# Patient Record
Sex: Male | Born: 1965
Health system: Southern US, Community
[De-identification: ages and names within clinical notes are randomized; demographics above are authoritative.]

## PROBLEM LIST (undated history)

## (undated) ENCOUNTER — Ambulatory Visit (HOSPITAL_COMMUNITY): Admission: EM | Payer: BC Managed Care – PPO

## (undated) DIAGNOSIS — F329 Major depressive disorder, single episode, unspecified: Secondary | ICD-10-CM

## (undated) DIAGNOSIS — K219 Gastro-esophageal reflux disease without esophagitis: Secondary | ICD-10-CM

## (undated) DIAGNOSIS — F32A Depression, unspecified: Secondary | ICD-10-CM

## (undated) DIAGNOSIS — G4700 Insomnia, unspecified: Secondary | ICD-10-CM

## (undated) DIAGNOSIS — F419 Anxiety disorder, unspecified: Secondary | ICD-10-CM

## (undated) DIAGNOSIS — K449 Diaphragmatic hernia without obstruction or gangrene: Secondary | ICD-10-CM

## (undated) DIAGNOSIS — G4730 Sleep apnea, unspecified: Secondary | ICD-10-CM

## (undated) HISTORY — PX: KNEE ARTHROSCOPY: SUR90

## (undated) HISTORY — PX: OTHER SURGICAL HISTORY: SHX169

---

## 2010-06-08 ENCOUNTER — Ambulatory Visit (INDEPENDENT_AMBULATORY_CARE_PROVIDER_SITE_OTHER): Payer: Private Health Insurance - Indemnity

## 2010-06-08 ENCOUNTER — Inpatient Hospital Stay (INDEPENDENT_AMBULATORY_CARE_PROVIDER_SITE_OTHER)
Admission: RE | Admit: 2010-06-08 | Discharge: 2010-06-08 | Disposition: A | Discharge status: Home / Self Care | Payer: Private Health Insurance - Indemnity | Source: Ambulatory Visit | Attending: Family Medicine | Admitting: Family Medicine

## 2010-06-08 DIAGNOSIS — J019 Acute sinusitis, unspecified: Secondary | ICD-10-CM

## 2010-06-08 LAB — POCT RAPID STREP A: Streptococcus, Group A Screen (Direct): NEGATIVE

## 2011-02-09 ENCOUNTER — Emergency Department (HOSPITAL_COMMUNITY)
Admission: EM | Admit: 2011-02-09 | Discharge: 2011-02-09 | Disposition: A | Discharge status: Home / Self Care | Payer: Managed Care, Other (non HMO) | Source: Home / Self Care | Attending: Emergency Medicine | Admitting: Emergency Medicine

## 2011-02-09 ENCOUNTER — Encounter (HOSPITAL_COMMUNITY): Payer: Self-pay | Admitting: *Deleted

## 2011-02-09 DIAGNOSIS — J40 Bronchitis, not specified as acute or chronic: Secondary | ICD-10-CM

## 2011-02-09 HISTORY — DX: Sleep apnea, unspecified: G47.30

## 2011-02-09 HISTORY — DX: Insomnia, unspecified: G47.00

## 2011-02-09 HISTORY — DX: Gastro-esophageal reflux disease without esophagitis: K21.9

## 2011-02-09 HISTORY — DX: Anxiety disorder, unspecified: F41.9

## 2011-02-09 HISTORY — DX: Depression, unspecified: F32.A

## 2011-02-09 HISTORY — DX: Diaphragmatic hernia without obstruction or gangrene: K44.9

## 2011-02-09 HISTORY — DX: Major depressive disorder, single episode, unspecified: F32.9

## 2011-02-09 MED ORDER — PSEUDOEPHEDRINE HCL 60 MG PO TABS: 60.0000 mg | ORAL_TABLET | Freq: Four times a day (QID) | ORAL | Status: AC | PRN

## 2011-02-09 MED ORDER — AMOXICILLIN 500 MG PO CAPS: 500.0000 mg | ORAL_CAPSULE | Freq: Three times a day (TID) | ORAL | Status: AC

## 2011-02-09 NOTE — ED Notes (Signed)
C/O productive cough - much worse at night.  Also c/o fatigue.  Started w/ cold/URI sxs beginning Jan w/ fever/chills - had improved, but states "I just can't quite get over it".  C/O postnasal drip with right sided sinus pressure.  Has been taking Nyquil with some improvement in cough at night with it; was taking Sudafed when initial sxs started.

## 2011-02-09 NOTE — ED Provider Notes (Signed)
History     CSN: [id number]  Arrival date & time 02/09/11  1249   First MD Initiated Contact with Patient 02/09/11 1319      Chief Complaint  Patient presents with  . Cough    (Consider location/radiation/quality/duration/timing/severity/associated sxs/prior treatment) HPI Comments: X 3 WEEKS, COUGHING GETTING WORSE WITH SINUS CONGESTION AND POST-NASAL DRAINAGE" NO FEVERS  Patient is a 46 y.o. male presenting with cough. The history is provided by the patient.  Cough This is a chronic (3 WEEKS) problem. The current episode started more than 1 week ago. The problem has not changed since onset.The cough is productive of sputum. There has been no fever. Associated symptoms include ear congestion and shortness of breath. Pertinent negatives include no chills and no wheezing. He has tried nothing for the symptoms. The treatment provided no relief. His past medical history is significant for bronchitis.    Past Medical History  Diagnosis Date  . Sleep apnea   . Depression   . Anxiety   . Insomnia   . Hiatal hernia   . GERD (gastroesophageal reflux disease)     Past Surgical History  Procedure Date  . Knee arthroscopy   . Hydrocele     No family history on file.  History  Substance Use Topics  . Smoking status: Former Smoker    Types: Cigars  . Smokeless tobacco: Not on file  . Alcohol Use: No      Review of Systems  Constitutional: Negative for fever and chills.  HENT: Positive for congestion.   Respiratory: Positive for cough and shortness of breath. Negative for wheezing.     Allergies  Review of patient's allergies indicates no known allergies.  Home Medications   Current Outpatient Rx  Name Route Sig Dispense Refill  . CLONAZEPAM PO Oral Take 4 mg by mouth at bedtime as needed.    Marland Kitchen LISINOPRIL 40 MG PO TABS Oral Take 40 mg by mouth daily.    Marland Kitchen OMEPRAZOLE 20 MG PO CPDR Oral Take 20 mg by mouth as needed.    . SERTRALINE HCL 50 MG PO TABS Oral Take 50 mg  by mouth daily.    . TESTOSTERONE ENANTHATE 200 MG/ML IM OIL Intramuscular Inject into the muscle every 14 (fourteen) days. For IM use only    . TRIAMCINOLONE ACETONIDE NA Nasal Place into the nose.    Marland Kitchen AMOXICILLIN 500 MG PO CAPS Oral Take 1 capsule (500 mg total) by mouth 3 (three) times daily. 30 capsule 0  . PSEUDOEPHEDRINE HCL 60 MG PO TABS Oral Take 1 tablet (60 mg total) by mouth every 6 (six) hours as needed for congestion. 15 tablet 0    BP 130/79  Pulse 76  Temp(Src) 98.1 F (36.7 C) (Oral)  Resp 18  SpO2 95%  Physical Exam  Nursing note and vitals reviewed. Constitutional: He appears well-developed and well-nourished. No distress.  HENT:  Head: Normocephalic.  Right Ear: Tympanic membrane normal.  Left Ear: Tympanic membrane normal.  Mouth/Throat: Uvula is midline and mucous membranes are normal. Posterior oropharyngeal erythema present.  Eyes: Conjunctivae are normal.  Neck: Neck supple. No JVD present.  Pulmonary/Chest: Effort normal. No respiratory distress. He has no decreased breath sounds. He has no wheezes. He has no rhonchi. He has no rales. He exhibits no tenderness.  Lymphadenopathy:    He has no cervical adenopathy.    ED Course  Procedures (including critical care time)  Labs Reviewed - No data to display No results found.  1. Bronchitis       MDM  Bronchitis post-ILI (AFEBRILE-NO RESPIRATORY DISTRESS)        Jimmie Molly, MD 02/09/11 [phone]

## 2013-02-27 IMAGING — CR DG CHEST 2V
2 series · 2 of 2 positions shown · non-contrast
Comparison: None.

CLINICAL DATA: Cough for 3-4 days, decreased oxygen saturation

CHEST - 2 VIEW

[view not recorded (1 of 2)]
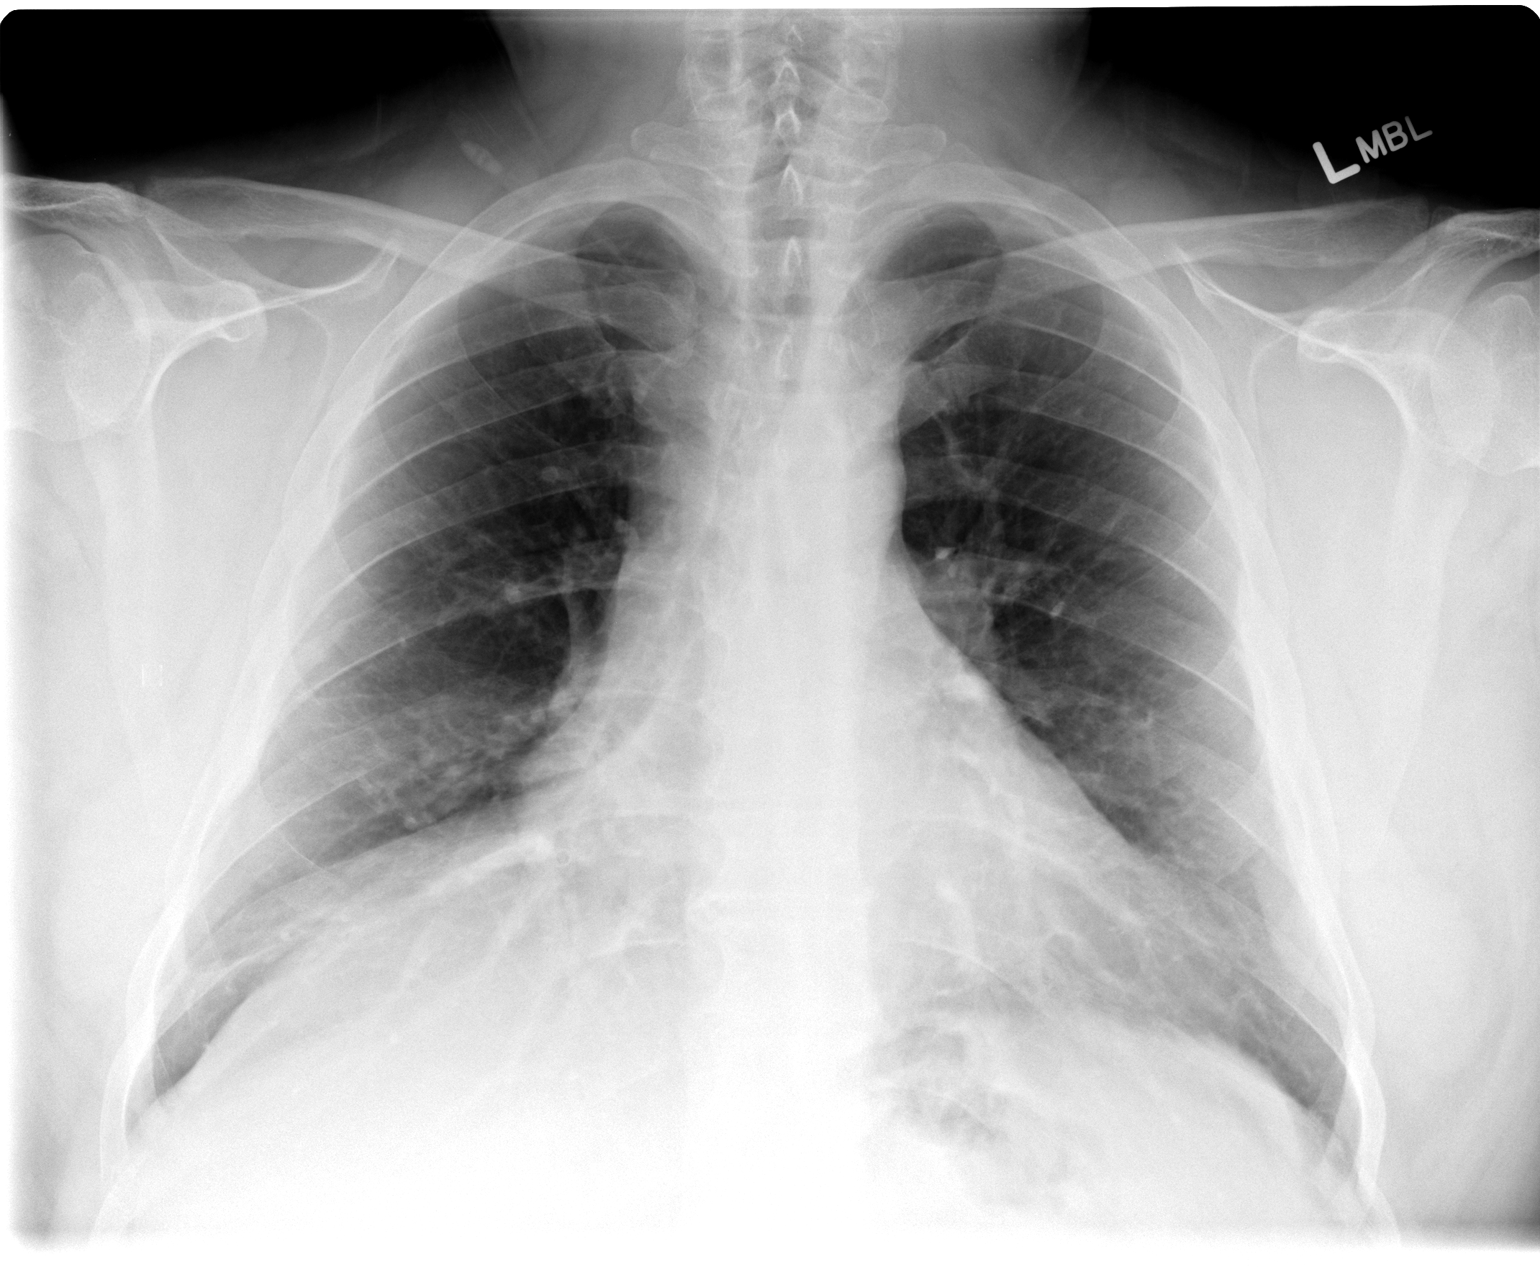

[view not recorded (2 of 2)]
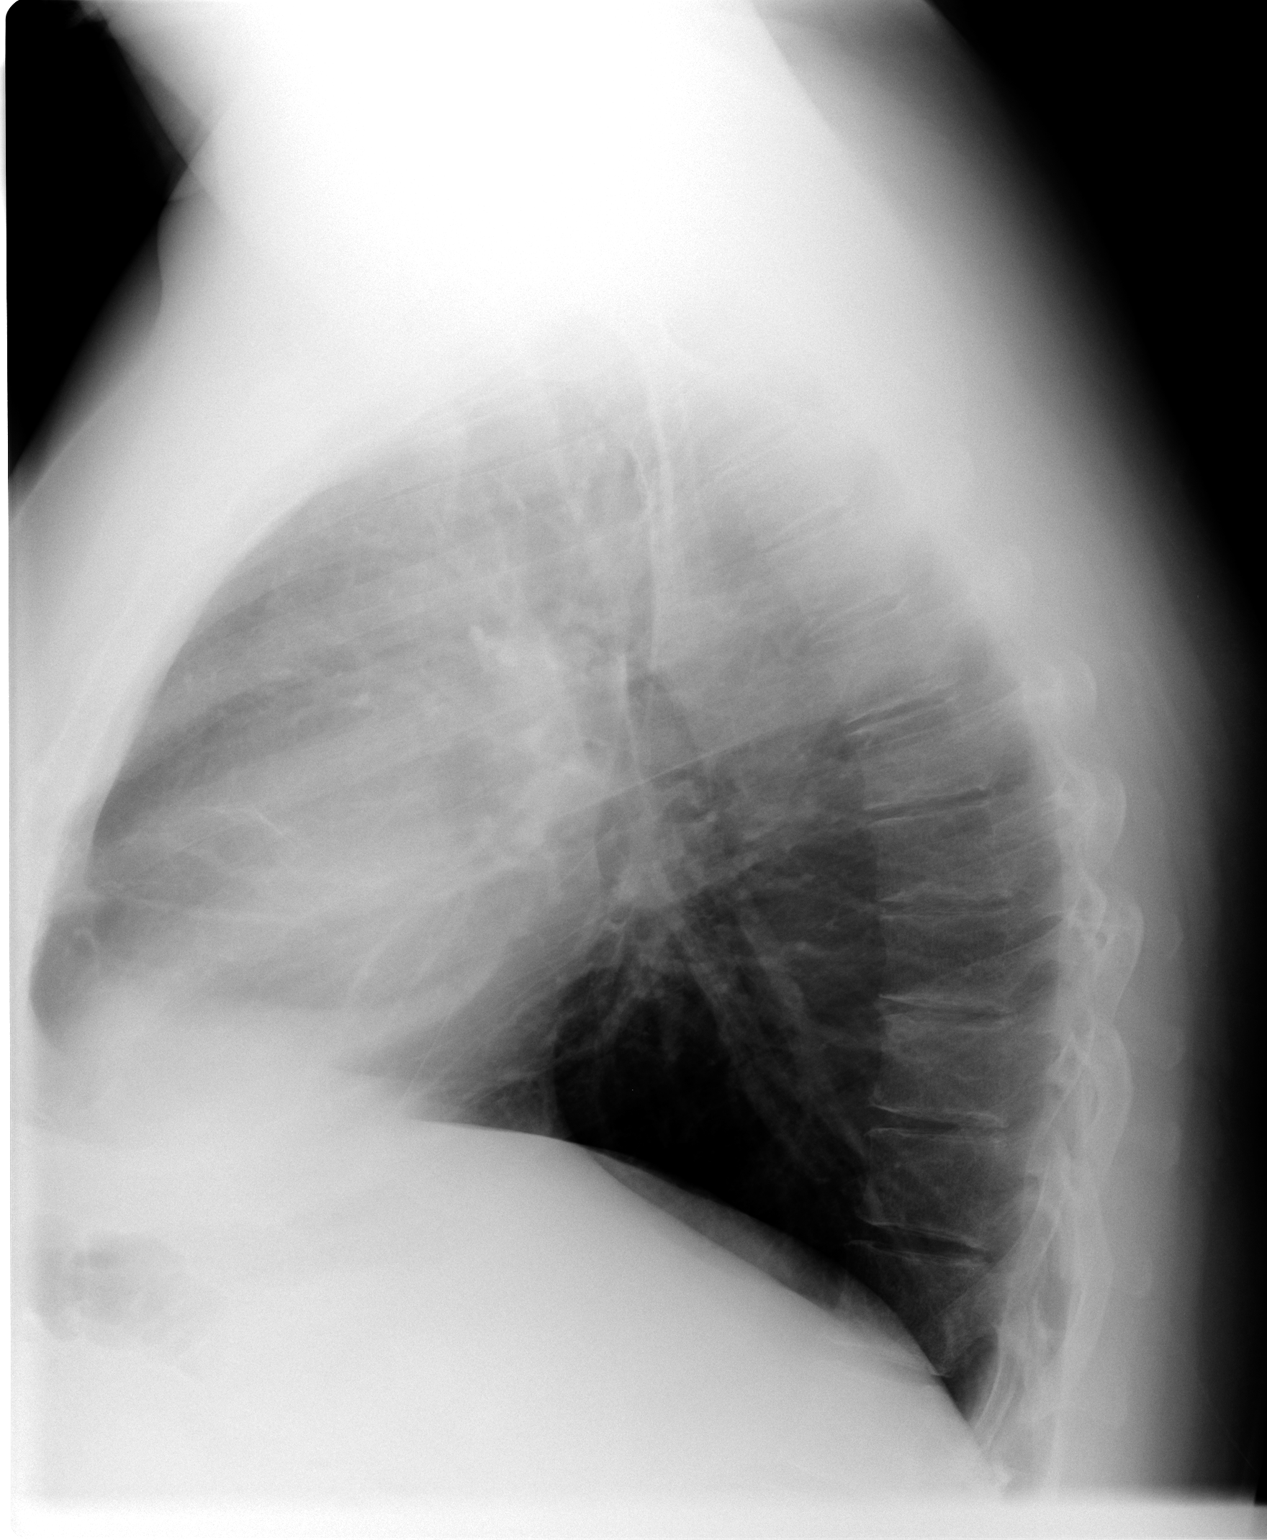

[2 of 2 positions shown; findings below may reference images not displayed]

FINDINGS: No active infiltrate or effusion is seen.  Linear
scarring or atelectasis is noted at the right lung base.  The heart
is within upper limits of normal.  No bony abnormality is seen.
IMPRESSION: Mild right basilar linear atelectasis or scarring.  No active lung
disease.

## 2014-01-17 DIAGNOSIS — G4733 Obstructive sleep apnea (adult) (pediatric): Secondary | ICD-10-CM | POA: Insufficient documentation

## 2014-01-17 DIAGNOSIS — G4730 Sleep apnea, unspecified: Secondary | ICD-10-CM | POA: Insufficient documentation

## 2015-10-30 DIAGNOSIS — G4733 Obstructive sleep apnea (adult) (pediatric): Secondary | ICD-10-CM | POA: Diagnosis not present

## 2015-10-31 DIAGNOSIS — F329 Major depressive disorder, single episode, unspecified: Secondary | ICD-10-CM | POA: Diagnosis not present

## 2015-12-11 DIAGNOSIS — F329 Major depressive disorder, single episode, unspecified: Secondary | ICD-10-CM | POA: Diagnosis not present

## 2015-12-17 DIAGNOSIS — J069 Acute upper respiratory infection, unspecified: Secondary | ICD-10-CM | POA: Diagnosis not present

## 2015-12-17 DIAGNOSIS — J019 Acute sinusitis, unspecified: Secondary | ICD-10-CM | POA: Diagnosis not present

## 2015-12-17 DIAGNOSIS — J310 Chronic rhinitis: Secondary | ICD-10-CM | POA: Diagnosis not present

## 2015-12-17 DIAGNOSIS — R05 Cough: Secondary | ICD-10-CM | POA: Diagnosis not present

## 2015-12-24 DIAGNOSIS — J3489 Other specified disorders of nose and nasal sinuses: Secondary | ICD-10-CM | POA: Diagnosis not present

## 2015-12-24 DIAGNOSIS — J310 Chronic rhinitis: Secondary | ICD-10-CM | POA: Diagnosis not present

## 2015-12-24 DIAGNOSIS — J0191 Acute recurrent sinusitis, unspecified: Secondary | ICD-10-CM | POA: Diagnosis not present

## 2015-12-24 DIAGNOSIS — J342 Deviated nasal septum: Secondary | ICD-10-CM | POA: Diagnosis not present

## 2016-02-01 DIAGNOSIS — F329 Major depressive disorder, single episode, unspecified: Secondary | ICD-10-CM | POA: Diagnosis not present

## 2016-02-12 DIAGNOSIS — G4733 Obstructive sleep apnea (adult) (pediatric): Secondary | ICD-10-CM | POA: Diagnosis not present

## 2016-03-04 DIAGNOSIS — F329 Major depressive disorder, single episode, unspecified: Secondary | ICD-10-CM | POA: Diagnosis not present

## 2016-03-18 DIAGNOSIS — I10 Essential (primary) hypertension: Secondary | ICD-10-CM | POA: Diagnosis not present

## 2016-03-18 DIAGNOSIS — E559 Vitamin D deficiency, unspecified: Secondary | ICD-10-CM | POA: Diagnosis not present

## 2016-03-18 DIAGNOSIS — Z79899 Other long term (current) drug therapy: Secondary | ICD-10-CM | POA: Diagnosis not present

## 2016-03-18 DIAGNOSIS — J309 Allergic rhinitis, unspecified: Secondary | ICD-10-CM | POA: Diagnosis not present

## 2016-03-20 DIAGNOSIS — J0191 Acute recurrent sinusitis, unspecified: Secondary | ICD-10-CM | POA: Diagnosis not present

## 2016-03-21 DIAGNOSIS — R05 Cough: Secondary | ICD-10-CM | POA: Diagnosis not present

## 2016-03-21 DIAGNOSIS — J310 Chronic rhinitis: Secondary | ICD-10-CM | POA: Diagnosis not present

## 2016-03-21 DIAGNOSIS — R0982 Postnasal drip: Secondary | ICD-10-CM | POA: Diagnosis not present

## 2016-03-21 DIAGNOSIS — Z713 Dietary counseling and surveillance: Secondary | ICD-10-CM | POA: Diagnosis not present

## 2016-04-15 DIAGNOSIS — F329 Major depressive disorder, single episode, unspecified: Secondary | ICD-10-CM | POA: Diagnosis not present

## 2016-05-14 DIAGNOSIS — F329 Major depressive disorder, single episode, unspecified: Secondary | ICD-10-CM | POA: Diagnosis not present

## 2016-05-29 DIAGNOSIS — G4733 Obstructive sleep apnea (adult) (pediatric): Secondary | ICD-10-CM | POA: Diagnosis not present

## 2016-06-17 DIAGNOSIS — F329 Major depressive disorder, single episode, unspecified: Secondary | ICD-10-CM | POA: Diagnosis not present

## 2016-06-18 DIAGNOSIS — R1084 Generalized abdominal pain: Secondary | ICD-10-CM | POA: Diagnosis not present

## 2016-06-18 DIAGNOSIS — Z0000 Encounter for general adult medical examination without abnormal findings: Secondary | ICD-10-CM | POA: Diagnosis not present

## 2016-06-18 DIAGNOSIS — Z1389 Encounter for screening for other disorder: Secondary | ICD-10-CM | POA: Diagnosis not present

## 2016-06-18 DIAGNOSIS — Z131 Encounter for screening for diabetes mellitus: Secondary | ICD-10-CM | POA: Diagnosis not present

## 2016-06-18 DIAGNOSIS — R0602 Shortness of breath: Secondary | ICD-10-CM | POA: Diagnosis not present

## 2016-07-16 DIAGNOSIS — I10 Essential (primary) hypertension: Secondary | ICD-10-CM | POA: Diagnosis not present

## 2016-07-16 DIAGNOSIS — G4733 Obstructive sleep apnea (adult) (pediatric): Secondary | ICD-10-CM | POA: Diagnosis not present

## 2016-07-16 DIAGNOSIS — K760 Fatty (change of) liver, not elsewhere classified: Secondary | ICD-10-CM | POA: Diagnosis not present

## 2016-07-16 DIAGNOSIS — E7800 Pure hypercholesterolemia, unspecified: Secondary | ICD-10-CM | POA: Diagnosis not present

## 2016-08-13 DIAGNOSIS — F329 Major depressive disorder, single episode, unspecified: Secondary | ICD-10-CM | POA: Diagnosis not present

## 2016-09-02 DIAGNOSIS — G4733 Obstructive sleep apnea (adult) (pediatric): Secondary | ICD-10-CM | POA: Diagnosis not present

## 2016-09-17 DIAGNOSIS — F329 Major depressive disorder, single episode, unspecified: Secondary | ICD-10-CM | POA: Diagnosis not present

## 2016-10-01 DIAGNOSIS — F329 Major depressive disorder, single episode, unspecified: Secondary | ICD-10-CM | POA: Diagnosis not present

## 2016-10-06 DIAGNOSIS — R0982 Postnasal drip: Secondary | ICD-10-CM | POA: Diagnosis not present

## 2016-10-06 DIAGNOSIS — Z713 Dietary counseling and surveillance: Secondary | ICD-10-CM | POA: Diagnosis not present

## 2016-10-06 DIAGNOSIS — J310 Chronic rhinitis: Secondary | ICD-10-CM | POA: Diagnosis not present

## 2016-10-06 DIAGNOSIS — J069 Acute upper respiratory infection, unspecified: Secondary | ICD-10-CM | POA: Diagnosis not present

## 2016-10-29 DIAGNOSIS — G4733 Obstructive sleep apnea (adult) (pediatric): Secondary | ICD-10-CM | POA: Diagnosis not present

## 2016-11-05 DIAGNOSIS — F329 Major depressive disorder, single episode, unspecified: Secondary | ICD-10-CM | POA: Diagnosis not present

## 2017-01-14 DIAGNOSIS — F329 Major depressive disorder, single episode, unspecified: Secondary | ICD-10-CM | POA: Diagnosis not present

## 2017-01-26 DIAGNOSIS — G4733 Obstructive sleep apnea (adult) (pediatric): Secondary | ICD-10-CM | POA: Diagnosis not present

## 2017-01-28 DIAGNOSIS — G4733 Obstructive sleep apnea (adult) (pediatric): Secondary | ICD-10-CM | POA: Diagnosis not present

## 2017-02-25 DIAGNOSIS — F329 Major depressive disorder, single episode, unspecified: Secondary | ICD-10-CM | POA: Diagnosis not present

## 2017-03-18 ENCOUNTER — Encounter: Payer: Self-pay | Admitting: Family Medicine

## 2017-03-18 ENCOUNTER — Ambulatory Visit: Payer: BLUE CROSS/BLUE SHIELD | Admitting: Family Medicine

## 2017-03-18 ENCOUNTER — Other Ambulatory Visit: Payer: Self-pay

## 2017-03-18 VITALS — BP 128/72 | HR 70 | Temp 98.0°F | Ht 66.5 in | Wt 227.0 lb

## 2017-03-18 DIAGNOSIS — Z114 Encounter for screening for human immunodeficiency virus [HIV]: Secondary | ICD-10-CM

## 2017-03-18 DIAGNOSIS — I10 Essential (primary) hypertension: Secondary | ICD-10-CM | POA: Diagnosis not present

## 2017-03-18 DIAGNOSIS — Z1329 Encounter for screening for other suspected endocrine disorder: Secondary | ICD-10-CM | POA: Diagnosis not present

## 2017-03-18 DIAGNOSIS — Z131 Encounter for screening for diabetes mellitus: Secondary | ICD-10-CM | POA: Diagnosis not present

## 2017-03-18 DIAGNOSIS — Z13220 Encounter for screening for lipoid disorders: Secondary | ICD-10-CM | POA: Diagnosis not present

## 2017-03-18 DIAGNOSIS — F329 Major depressive disorder, single episode, unspecified: Secondary | ICD-10-CM

## 2017-03-18 DIAGNOSIS — Z1211 Encounter for screening for malignant neoplasm of colon: Secondary | ICD-10-CM

## 2017-03-18 DIAGNOSIS — F322 Major depressive disorder, single episode, severe without psychotic features: Secondary | ICD-10-CM | POA: Insufficient documentation

## 2017-03-18 DIAGNOSIS — F32A Depression, unspecified: Secondary | ICD-10-CM

## 2017-03-18 MED ORDER — LISINOPRIL 40 MG PO TABS: 40.0000 mg | ORAL_TABLET | Freq: Every day | ORAL | 0 refills | Status: DC

## 2017-03-18 NOTE — Patient Instructions (Signed)

## 2017-03-18 NOTE — Progress Notes (Signed)
Subjective:   Chief Complaint  Patient presents with  . Establish Care  . Hypertension   HPI Charles Costa is a 52 y.o. old male here  for annual exam.  Concern today: Hypertension, well controlled with current medication Changes in his/her health in the last 12 months: yes, increased stress/anxiety due to multiple death in the family. Occupation:  Unemployed Wears seatbelt: yes.    The patient has regular exercise: no.   Enough vegetables and fruits: yes.  Smokes cigarette: no Drinks EtOH: yes Drug use: no Patient takes ASA: no.  Patient takes vitD & Ca: no. Ever been transfused or tattooed?: no.  The patient is not sexually active.  Patient uses birth control: not applicable.  Domestic violence: not applicable.  Advance directive: no. MOST: no.   History of depression:yes.  Patient dental home: yes.   Immunizations  Needs influenza vaccine: no.  Needs HPV (Women until age 53): not applicable.  Needs Shingrix (all >67yr of age): yes.  Needs Tdap: no.  Needs Pneumococcal: no. 1. 118to 50years of age  -Intermediate risk groups (smokers; chronic heart, lung and liver  disease, DM & alcoholism) PPSV23 alone:  (Grade 1B).   -High risk groups (asplenia, immunocompromised [HIV, CA], CSF leak, cochlear implant, advanced kidney dis)-PCV13, then PPSV23 after 8 wks. (Grade 1B). PCV13 after 89yrf already had PPSV23.  2.   Age ? 65: PCV13 followed by PPSV23 6 to 12 months later. PCV13 after 1y49yr already had PPSV23.  Screening Need colon cancer screening: yes. Need breast cancer ccreening: not applicable. Need cervical cancer Screening: not applicable. STOP BANG >/=3 for OSA: no. Need lung cancer screening (men > 55):no. Need AAA screening (men 65-74, >100 cigarettes):no At risk for skin cancer: no. Need HCV Screening: no. Need STI Screening: no. Fall in the last 12 months:no  PMH/Problem List: has Essential hypertension and Depression on their problem list.   has a  past medical history of Anxiety, Depression, GERD (gastroesophageal reflux disease), Hiatal hernia, Insomnia, and Sleep apnea.  [facility]. No pertinent family history. Family history of heart disease before age of 60 57s: yes. Family history of stroke: no. Family history of cancer: yes.  SH Social History   Tobacco Use  . Smoking status: Former Smoker    Types: Cigars  . Smokeless tobacco: Never Used  Substance Use Topics  . Alcohol use: No  . Drug use: No     Review of Systems      Objective:   Physical Exam Vitals:   03/18/17 1409  BP: 128/72  Pulse: 70  Temp: 98 F (36.7 C)  TempSrc: Oral  SpO2: 97%  Weight: 227 lb (103 kg)  Height: 5' 6.5" (1.689 m)   Body mass index is 36.09 kg/m.  GEN: appears well, no apparent distress. Head: normocephalic and atraumatic  Eyes: conjunctiva without injection, sclera anicteric Ears: external ear and ear canal normal Nares: no rhinorrhea, congestion or erythema  Oropharynx: mmm without erythema or exudation HEM: negative for cervical or periauricular lymphadenopathies CVS: RRR, nl s1 & s2, no murmurs, no edema,  2+ DP & PT bil RESP: no IWOB, good air movement bilaterally, CTAB GI: BS present & normal, soft, NTND, no guarding, no rebound, no mass GU: no suprapubic or CVA tenderness MSK: no focal tenderness or notable swelling SKIN: no apparent skin lesion  ENDO: negative thyromegally  NEURO: alert and oiented appropriately, no gross deficits  PSYCH: euthymic mood with congruent affect  Assessment & Plan:  1. Essential hypertension Well controlledwith current medication. Patient is adamant that only Zestril works for him and would not like to be switched fro that medication to a generic. - lisinopril (ZESTRIL) 40 MG tablet; Take 1 tablet (40 mg total) by mouth daily.  Dispense: 90 tablet; Refill: 0 - CBC with Differential - CMP14+EGFR  2. Depression, unspecified depression type Followed by outpatient  therapist. Currently on clonazepam and Sertraline.   3. Screening for hyperlipidemia  - Lipid panel  4. Screening for diabetes mellitus  - Hemoglobin A1c  5. Screening for hypothyroidism  - TSH  6. Screening for colon cancer  - Ambulatory referral to Gastroenterology  7. Screening for HIV (human immunodeficiency virus)  - HIV antibody    Marjie Skiff, MD [facility] [place], PGY-2  03/18/17  5:28 PM

## 2017-03-19 ENCOUNTER — Telehealth: Payer: Self-pay

## 2017-03-19 LAB — CMP14+EGFR
ALT: 15 IU/L (ref 0–44)
AST: 16 IU/L (ref 0–40)
Albumin/Globulin Ratio: 1.9 (ref 1.2–2.2)
Albumin: 4.7 g/dL (ref 3.5–5.5)
Alkaline Phosphatase: 59 IU/L (ref 39–117)
BUN/Creatinine Ratio: 12 (ref 9–20)
BUN: 9 mg/dL (ref 6–24)
Bilirubin Total: 0.4 mg/dL (ref 0.0–1.2)
CO2: 24 mmol/L (ref 20–29)
Calcium: 9.9 mg/dL (ref 8.7–10.2)
Chloride: 100 mmol/L (ref 96–106)
Creatinine, Ser: 0.76 mg/dL (ref 0.76–1.27)
GFR calc Af Amer: 123 mL/min/{1.73_m2} (ref >59)
GFR calc non Af Amer: 106 mL/min/{1.73_m2} (ref >59)
Globulin, Total: 2.5 g/dL (ref 1.5–4.5)
Glucose: 98 mg/dL (ref 65–99)
Potassium: 4.2 mmol/L (ref 3.5–5.2)
Sodium: 139 mmol/L (ref 134–144)
Total Protein: 7.2 g/dL (ref 6.0–8.5)

## 2017-03-19 LAB — LIPID PANEL
Chol/HDL Ratio: 5 ratio (ref 0.0–5.0)
Cholesterol, Total: 285 mg/dL — ABNORMAL HIGH (ref 100–199)
HDL: 57 mg/dL (ref >39)
LDL Calculated: 197 mg/dL — ABNORMAL HIGH (ref 0–99)
Triglycerides: 154 mg/dL — ABNORMAL HIGH (ref 0–149)
VLDL Cholesterol Cal: 31 mg/dL (ref 5–40)

## 2017-03-19 LAB — CBC WITH DIFFERENTIAL/PLATELET
Basophils Absolute: 0 10*3/uL (ref 0.0–0.2)
Basos: 1 %
EOS (ABSOLUTE): 0.1 10*3/uL (ref 0.0–0.4)
Eos: 2 %
Hematocrit: 42.4 % (ref 37.5–51.0)
Hemoglobin: 14.3 g/dL (ref 13.0–17.7)
Immature Grans (Abs): 0 10*3/uL (ref 0.0–0.1)
Immature Granulocytes: 0 %
Lymphocytes Absolute: 1.9 10*3/uL (ref 0.7–3.1)
Lymphs: 31 %
MCH: 32.2 pg (ref 26.6–33.0)
MCHC: 33.7 g/dL (ref 31.5–35.7)
MCV: 96 fL (ref 79–97)
Monocytes Absolute: 0.4 10*3/uL (ref 0.1–0.9)
Monocytes: 6 %
Neutrophils Absolute: 3.6 10*3/uL (ref 1.4–7.0)
Neutrophils: 60 %
Platelets: 276 10*3/uL (ref 150–379)
RBC: 4.44 x10E6/uL (ref 4.14–5.80)
RDW: 14.2 % (ref 12.3–15.4)
WBC: 6 10*3/uL (ref 3.4–10.8)

## 2017-03-19 LAB — HIV ANTIBODY (ROUTINE TESTING W REFLEX): HIV Screen 4th Generation wRfx: NONREACTIVE

## 2017-03-19 LAB — HEMOGLOBIN A1C
Est. average glucose Bld gHb Est-mCnc: 108 mg/dL
Hgb A1c MFr Bld: 5.4 % (ref 4.8–5.6)

## 2017-03-19 LAB — TSH: TSH: 1.39 u[IU]/mL (ref 0.450–4.500)

## 2017-03-19 NOTE — Telephone Encounter (Signed)
Received fax from [facility] requesting prior authorization of Zestril.  PA submitted via covermymeds. Will recheck status in 24 hours. Wallace Cullens, RN

## 2017-03-23 NOTE — Telephone Encounter (Signed)
Brand only Zestril denied. Pharmacy informed generic is covered if patient is agreeable. Wallace Cullens, RN

## 2017-03-25 DIAGNOSIS — F329 Major depressive disorder, single episode, unspecified: Secondary | ICD-10-CM | POA: Diagnosis not present

## 2017-03-27 ENCOUNTER — Other Ambulatory Visit: Payer: Self-pay | Admitting: Family Medicine

## 2017-03-27 NOTE — Telephone Encounter (Signed)
Zestril (brand name) needs to be authorized for a 90 day refill.  Patient is not using insurance and is paying for it himself.  Call in to [facility] at [place], [employer].

## 2017-03-27 NOTE — Telephone Encounter (Signed)
Spoke with mother to see if the doctor needed to send a new script or if patient was able to use the printed one we gave him at his visit.  She said that patient had already picked up the medication and paid for it out of pocket and it cost him $1300.  Patient also cancelled his insurance since they wouldn't cover the script for him.  I explained to his mother that each insurance is going to want him to try generic versions of this medication before they will even consider the brand name.  Unless he is willing to do that there is nothing as an office that we can do.  I did offer her to try [place] and see if this will help his out of pocket cost.  Mother was appreciative and will look to see if brand names are covered. Jazmin Hartsell,CMA

## 2017-03-27 NOTE — Telephone Encounter (Signed)
Will forward to MD. Jazmin Hartsell,CMA  

## 2017-03-30 ENCOUNTER — Other Ambulatory Visit: Payer: Self-pay

## 2017-03-30 ENCOUNTER — Other Ambulatory Visit: Payer: Self-pay | Admitting: Family Medicine

## 2017-03-30 DIAGNOSIS — E785 Hyperlipidemia, unspecified: Secondary | ICD-10-CM | POA: Insufficient documentation

## 2017-03-30 DIAGNOSIS — I10 Essential (primary) hypertension: Secondary | ICD-10-CM

## 2017-03-30 MED ORDER — LISINOPRIL 40 MG PO TABS: 40.0000 mg | ORAL_TABLET | Freq: Every day | ORAL | 1 refills | Status: DC

## 2017-03-30 MED ORDER — ATORVASTATIN CALCIUM 40 MG PO TABS: 40.0000 mg | ORAL_TABLET | Freq: Every day | ORAL | 0 refills | Status: DC

## 2017-03-30 NOTE — Telephone Encounter (Signed)
Pt called nurse line, left message requesting a refill on Zestril. Called [facility] to find out if he picked up the rx we sent earlier this month- we sent in 90 tabs. Per [facility] pt did pick up Rx #90 tabs. Called patient and left voice mail that he picked up 3 month supply and will not need refills until June. Advised to call back with further questions. Wallace Cullens, RN

## 2017-03-30 NOTE — Telephone Encounter (Signed)
Pt calling back to nurse line stating he wants refill of his Zestril called into [facility], even though he is not due for refill until June as he received a 90 day supply. Pt upset that he wasn't prescribed medication with a refill in the first place. Refill to [facility] [place] Pt call back [phone] Will forward to MD Wallace Cullens, RN

## 2017-04-20 ENCOUNTER — Encounter: Payer: Self-pay | Admitting: Family Medicine

## 2017-04-28 ENCOUNTER — Telehealth: Payer: Self-pay

## 2017-04-28 NOTE — Telephone Encounter (Signed)
VM left on pts phone with  GI information.

## 2017-04-28 NOTE — Telephone Encounter (Signed)
-----   Message from Marjie Skiff, MD sent at 04/21/2017  5:23 PM EDT ----- Page,  Could you please get in touch with patient and inform that [facility] has been trying to schedule an appointment with him but have been unable to reach him. He should call them to schedule an appointment.  Thank you  Marjie Skiff, MD [facility], PGY-2

## 2017-05-21 DIAGNOSIS — G4733 Obstructive sleep apnea (adult) (pediatric): Secondary | ICD-10-CM | POA: Diagnosis not present

## 2017-05-27 DIAGNOSIS — F329 Major depressive disorder, single episode, unspecified: Secondary | ICD-10-CM | POA: Diagnosis not present

## 2017-06-24 DIAGNOSIS — F329 Major depressive disorder, single episode, unspecified: Secondary | ICD-10-CM | POA: Diagnosis not present

## 2017-07-22 DIAGNOSIS — F329 Major depressive disorder, single episode, unspecified: Secondary | ICD-10-CM | POA: Diagnosis not present

## 2017-08-19 DIAGNOSIS — F329 Major depressive disorder, single episode, unspecified: Secondary | ICD-10-CM | POA: Diagnosis not present

## 2017-09-02 DIAGNOSIS — G4733 Obstructive sleep apnea (adult) (pediatric): Secondary | ICD-10-CM | POA: Diagnosis not present

## 2017-09-16 DIAGNOSIS — F329 Major depressive disorder, single episode, unspecified: Secondary | ICD-10-CM | POA: Diagnosis not present

## 2017-10-16 DIAGNOSIS — F329 Major depressive disorder, single episode, unspecified: Secondary | ICD-10-CM | POA: Diagnosis not present

## 2017-10-30 DIAGNOSIS — G4733 Obstructive sleep apnea (adult) (pediatric): Secondary | ICD-10-CM | POA: Diagnosis not present

## 2017-11-25 DIAGNOSIS — F329 Major depressive disorder, single episode, unspecified: Secondary | ICD-10-CM | POA: Diagnosis not present

## 2017-12-09 ENCOUNTER — Encounter: Payer: Self-pay | Admitting: Family Medicine

## 2017-12-09 ENCOUNTER — Ambulatory Visit: Payer: BLUE CROSS/BLUE SHIELD | Admitting: Family Medicine

## 2017-12-09 ENCOUNTER — Other Ambulatory Visit: Payer: Self-pay

## 2017-12-09 VITALS — BP 122/74 | HR 74 | Temp 98.0°F | Ht 67.0 in | Wt 224.0 lb

## 2017-12-09 DIAGNOSIS — I10 Essential (primary) hypertension: Secondary | ICD-10-CM | POA: Diagnosis not present

## 2017-12-09 MED ORDER — BENAZEPRIL HCL 40 MG PO TABS: 40.0000 mg | ORAL_TABLET | Freq: Every day | ORAL | 3 refills | Status: DC

## 2017-12-09 MED FILL — BENAZEPRIL HCL 40 MG TABLET: 40 | 30 days supply | Qty: 30 | Fill #0

## 2017-12-09 NOTE — Assessment & Plan Note (Signed)
Well-controlled hypertension.  Changing medications due to financial burden. -Discontinue lisinopril 40 mg -Start benazepril 40 mg -Patient follow-up in 1 month

## 2017-12-09 NOTE — Progress Notes (Signed)
Established Patient Office Visit  Subjective:  Patient ID: Charles Costa, male    DOB: Jan 02, 1966  Age: 52 y.o. MRN: [id number]  CC:  Chief Complaint  Patient presents with  . Hypertension    HPI Patient has been on namebrand with for several years.  He is tried other generic medication without control of his blood pressure.  Patient is currently having to pay out of pocket for lisinopril namebrand and is costing him "$1300 a month".  Patient is interested in trying medication to lower the cost.  After discussion with patient, will try medication within the same class at equivalent dosing.  Hypertension  This is a chronic problem. The current episode started more than 1 year ago. The problem is unchanged. The problem is controlled. Pertinent negatives include no anxiety, blurred vision, chest pain, headaches, malaise/fatigue, neck pain, palpitations or shortness of breath. There are no associated agents to hypertension. Risk factors for coronary artery disease include male gender, obesity and dyslipidemia. Past treatments include ACE inhibitors. There are no compliance problems.  None. There is no history of chronic renal disease, hyperaldosteronism, pheochromocytoma, sleep apnea or a thyroid problem.     Past Medical History:  Diagnosis Date  . Anxiety   . Depression   . GERD (gastroesophageal reflux disease)   . Hiatal hernia   . Insomnia   . Sleep apnea     Past Surgical History:  Procedure Laterality Date  . hydrocele    . KNEE ARTHROSCOPY      No family history on file.  Social History   Socioeconomic History  . Marital status: Single    Spouse name: Not on file  . Number of children: Not on file  . Years of education: Not on file  . Highest education level: Not on file  Occupational History  . Not on file  Social Needs  . Financial resource strain: Not on file  . Food insecurity:    Worry: Not on file    Inability: Not on file  . Transportation needs:     Medical: Not on file    Non-medical: Not on file  Tobacco Use  . Smoking status: Former Smoker    Types: Cigars  . Smokeless tobacco: Never Used  Substance and Sexual Activity  . Alcohol use: No  . Drug use: No  . Sexual activity: Not on file  Lifestyle  . Physical activity:    Days per week: Not on file    Minutes per session: Not on file  . Stress: Not on file  Relationships  . Social connections:    Talks on phone: Not on file    Gets together: Not on file    Attends religious service: Not on file    Active member of club or organization: Not on file    Attends meetings of clubs or organizations: Not on file    Relationship status: Not on file  . Intimate partner violence:    Fear of current or ex partner: Not on file    Emotionally abused: Not on file    Physically abused: Not on file    Forced sexual activity: Not on file  Other Topics Concern  . Not on file  Social History Narrative  . Not on file    Outpatient Medications Prior to Visit  Medication Sig Dispense Refill  . atorvastatin (LIPITOR) 40 MG tablet Take 1 tablet (40 mg total) by mouth daily. 90 tablet 0  . CLONAZEPAM PO Take 4  mg by mouth at bedtime as needed.    Marland Kitchen ipratropium (ATROVENT) 0.06 % nasal spray Place 2 sprays into both nostrils 3 (three) times daily.    Marland Kitchen lisinopril (PRINIVIL,ZESTRIL) 40 MG tablet Take 40 mg by mouth daily.    Marland Kitchen lisinopril (ZESTRIL) 40 MG tablet Take 1 tablet (40 mg total) by mouth daily. 90 tablet 1  . omeprazole (PRILOSEC) 20 MG capsule Take 20 mg by mouth as needed.    . sertraline (ZOLOFT) 50 MG tablet Take 50 mg by mouth daily.    Marland Kitchen testosterone enanthate (DELATESTRYL) 200 MG/ML injection Inject into the muscle every 14 (fourteen) days. For IM use only    . TRIAMCINOLONE ACETONIDE NA Place into the nose.     No facility-administered medications prior to visit.     No Known Allergies  ROS Review of Systems  Constitutional: Negative for malaise/fatigue.  Eyes:  Negative for blurred vision.  Respiratory: Negative for shortness of breath.   Cardiovascular: Negative for chest pain and palpitations.  Musculoskeletal: Negative for neck pain.  Neurological: Negative for headaches.  All other systems reviewed and are negative.     Objective:    Physical Exam  Constitutional: He is oriented to person, place, and time. He appears well-developed and well-nourished.  HENT:  Head: Normocephalic and atraumatic.  Eyes: No scleral icterus.  Neck: No JVD present. No tracheal deviation present.  Cardiovascular: Normal rate and regular rhythm.  Pulmonary/Chest: Effort normal and breath sounds normal.  Abdominal: Soft. He exhibits no distension.  Musculoskeletal: Normal range of motion. He exhibits no edema.  Neurological: He is alert and oriented to person, place, and time.  Skin: Skin is warm and dry.  Psychiatric:  Slightly anxious    BP 122/74   Pulse 74   Temp 98 F (36.7 C) (Oral)   Ht 5\' 7"  (1.702 m)   Wt 224 lb (101.6 kg)   SpO2 98%   BMI 35.08 kg/m  Wt Readings from Last 3 Encounters:  12/09/17 224 lb (101.6 kg)  03/18/17 227 lb (103 kg)     Health Maintenance Due  Topic Date Due  . COLONOSCOPY  02/06/2017    There are no preventive care reminders to display for this patient.  Lab Results  Component Value Date   TSH 1.390 03/18/2017   Lab Results  Component Value Date   WBC 6.0 03/18/2017   HGB 14.3 03/18/2017   HCT 42.4 03/18/2017   MCV 96 03/18/2017   PLT 276 03/18/2017   Lab Results  Component Value Date   NA 139 03/18/2017   K 4.2 03/18/2017   CO2 24 03/18/2017   GLUCOSE 98 03/18/2017   BUN 9 03/18/2017   CREATININE 0.76 03/18/2017   BILITOT 0.4 03/18/2017   ALKPHOS 59 03/18/2017   AST 16 03/18/2017   ALT 15 03/18/2017   PROT 7.2 03/18/2017   ALBUMIN 4.7 03/18/2017   CALCIUM 9.9 03/18/2017   Lab Results  Component Value Date   CHOL 285 (H) 03/18/2017   Lab Results  Component Value Date   HDL 57  03/18/2017   Lab Results  Component Value Date   LDLCALC 197 (H) 03/18/2017   Lab Results  Component Value Date   TRIG 154 (H) 03/18/2017   Lab Results  Component Value Date   CHOLHDL 5.0 03/18/2017   Lab Results  Component Value Date   HGBA1C 5.4 03/18/2017      Assessment & Plan:   Problem List Items Addressed This  Visit      Cardiovascular and Mediastinum   Benign hypertension - Primary    Well-controlled hypertension.  Changing medications due to financial burden. -Discontinue lisinopril 40 mg -Start benazepril 40 mg -Patient follow-up in 1 month      Relevant Medications   benazepril (LOTENSIN) 40 MG tablet      Meds ordered this encounter  Medications  . benazepril (LOTENSIN) 40 MG tablet    Sig: Take 1 tablet (40 mg total) by mouth daily.    Dispense:  30 tablet    Refill:  3    Generic allowable, may switch to another ACE inhibitor covered on insurance and appropriate dosage (MAX equivalent of lisinopril)    Follow-up: Return in 4 weeks (on 01/06/2018).    Bonnita Hollow, MD

## 2017-12-22 DIAGNOSIS — F329 Major depressive disorder, single episode, unspecified: Secondary | ICD-10-CM | POA: Diagnosis not present

## 2018-01-04 DIAGNOSIS — R05 Cough: Secondary | ICD-10-CM | POA: Diagnosis not present

## 2018-01-04 DIAGNOSIS — J019 Acute sinusitis, unspecified: Secondary | ICD-10-CM | POA: Diagnosis not present

## 2018-01-04 DIAGNOSIS — J069 Acute upper respiratory infection, unspecified: Secondary | ICD-10-CM | POA: Diagnosis not present

## 2018-01-04 DIAGNOSIS — J310 Chronic rhinitis: Secondary | ICD-10-CM | POA: Diagnosis not present

## 2018-01-08 MED FILL — BENAZEPRIL HCL 40 MG TABLET: 40 | 30 days supply | Qty: 30 | Fill #1

## 2018-01-11 NOTE — Progress Notes (Signed)
  Subjective:   Patient ID: Charles Costa    DOB: [birth year], 53 y.o. male   MRN: [id number]  Charles Costa is a 53 y.o. 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 Previously seen ENT and Asthma/Allergy specialist. Reports previously undergoing allergy testing without any known allergies, last saw spe - [place] asthma and allergy, last saw 01/04/18. Previously tested for allergies with no known allergies, not on daily PO allergy meds. - previously saw Dr. Vicente Masson at [facility], was told he had a badly deviated nasal septum but did not recommend surgery. - managed with atrovent and nasacort sprays but does typically require antibiotics and prednisone 2x per year - also with OSA and chronic R ear pain - wants referral to ENT (no [facility] or [employer] area) - last had CT 03/2016:  "1. Mild paranasal inflammatory paranasal sinus disease with opacification of the right maxillary infundibulum.  2. There is thinning of the mucosa along the undersurface of the hard palate without evidence for bony erosion which is nonspecific and may relate to prior inflammation.  3. Rightward septal deviation.  4. Enlargement of the left middle and inferior turbinates relative to the right, which may reflect normal nasal cycling versus mild turbinate hypertrophy."   Review of Systems:  Per HPI.  [place], medications and smoking status reviewed.  Objective:   BP 130/80   Pulse 72   Temp 98.3 F (36.8 C) (Oral)   Wt 224 lb 4 oz (101.7 kg)   SpO2 97%   BMI 35.12 kg/m  Vitals and nursing note reviewed.  General:  obese male, in no acute distress with non-toxic appearance HEENT: normocephalic, atraumatic, moist mucous membranes, fair dentition. Oropharynx clear with exudate or erythema. No appreciable nasal polyps. TMs clear bilaterally without bulging. CV: regular rate and rhythm without murmurs, rubs, or gallops, no lower extremity edema Lungs: clear to auscultation bilaterally with normal work of breathing Extremities: warm and well perfused, normal tone MSK: ROM grossly intact, strength intact, gait normal Neuro: Alert and oriented, speech normal  Assessment & Plan:   Benign hypertension Patient doing well on current regimen. Obtaining BMP today. Follow up in 3-6 months with PCP.  Other chronic sinusitis Without current exacerbation. Referral made to ENT for surgical consultation per patient request.  Orders Placed This Encounter  Procedures  . Basic Metabolic Panel  . Ambulatory referral to ENT    Referral Priority:   Routine    Referral Type:   Consultation    Referral Reason:   Specialty Services Required    Requested Specialty:   Otolaryngology    Number of Visits Requested:   1   Meds ordered this encounter  Medications  . benazepril (LOTENSIN) 40 MG tablet    Sig: Take 1 tablet (40 mg total) by mouth daily.    Dispense:  90 tablet    Refill:  2    Generic allowable, may switch to another ACE inhibitor covered on insurance and appropriate dosage (MAX equivalent of lisinopril)   Rory Percy, DO PGY-2, [facility] 01/12/2018 4:12 PM

## 2018-01-12 ENCOUNTER — Ambulatory Visit (INDEPENDENT_AMBULATORY_CARE_PROVIDER_SITE_OTHER): Payer: BLUE CROSS/BLUE SHIELD | Admitting: Family Medicine

## 2018-01-12 ENCOUNTER — Other Ambulatory Visit: Payer: Self-pay

## 2018-01-12 ENCOUNTER — Ambulatory Visit: Payer: BLUE CROSS/BLUE SHIELD | Admitting: Family Medicine

## 2018-01-12 VITALS — BP 130/80 | HR 72 | Temp 98.3°F | Wt 224.2 lb

## 2018-01-12 DIAGNOSIS — J328 Other chronic sinusitis: Secondary | ICD-10-CM | POA: Insufficient documentation

## 2018-01-12 DIAGNOSIS — J342 Deviated nasal septum: Secondary | ICD-10-CM | POA: Insufficient documentation

## 2018-01-12 DIAGNOSIS — I10 Essential (primary) hypertension: Secondary | ICD-10-CM

## 2018-01-12 MED ORDER — BENAZEPRIL HCL 40 MG PO TABS: 40.0000 mg | ORAL_TABLET | Freq: Every day | ORAL | 2 refills | Status: DC

## 2018-01-12 NOTE — Assessment & Plan Note (Signed)
Without current exacerbation. Referral made to ENT for surgical consultation per patient request.

## 2018-01-12 NOTE — Assessment & Plan Note (Signed)
Patient doing well on current regimen. Obtaining BMP today. Follow up in 3-6 months with PCP.

## 2018-01-12 NOTE — Patient Instructions (Signed)
It was great to see you!  Our plans for today:  - We referred you to ENT. - No changes to your blood pressure medication.  We are checking some labs today, we will call you or send you a letter if they are abnormal.   Take care and seek immediate care sooner if you develop any concerns.   Dr. [facility]

## 2018-01-13 ENCOUNTER — Telehealth: Payer: Self-pay

## 2018-01-13 LAB — BASIC METABOLIC PANEL
BUN/Creatinine Ratio: 12 (ref 9–20)
BUN: 10 mg/dL (ref 6–24)
CO2: 24 mmol/L (ref 20–29)
Calcium: 9.6 mg/dL (ref 8.7–10.2)
Chloride: 100 mmol/L (ref 96–106)
Creatinine, Ser: 0.84 mg/dL (ref 0.76–1.27)
GFR calc Af Amer: 118 mL/min/{1.73_m2} (ref >59)
GFR calc non Af Amer: 102 mL/min/{1.73_m2} (ref >59)
Glucose: 96 mg/dL (ref 65–99)
Potassium: 4.6 mmol/L (ref 3.5–5.2)
Sodium: 139 mmol/L (ref 134–144)

## 2018-01-13 NOTE — Telephone Encounter (Signed)
-----   Message from Rory Percy, DO sent at 01/13/2018  8:31 AM EST ----- Please let patient know of normal results. Thanks!

## 2018-01-13 NOTE — Telephone Encounter (Signed)
Pt contacted and informed of normal results.

## 2018-01-13 NOTE — Progress Notes (Signed)
Lvm on pt phone. Left message stating that his test results were normal, and if any questions to give [place] a call at the office. Salvatore Marvel, CMA

## 2018-01-20 DIAGNOSIS — F329 Major depressive disorder, single episode, unspecified: Secondary | ICD-10-CM | POA: Diagnosis not present

## 2018-01-27 DIAGNOSIS — J310 Chronic rhinitis: Secondary | ICD-10-CM | POA: Diagnosis not present

## 2018-01-27 DIAGNOSIS — J342 Deviated nasal septum: Secondary | ICD-10-CM | POA: Diagnosis not present

## 2018-01-27 DIAGNOSIS — J328 Other chronic sinusitis: Secondary | ICD-10-CM | POA: Diagnosis not present

## 2018-01-27 DIAGNOSIS — Z6835 Body mass index (BMI) 35.0-35.9, adult: Secondary | ICD-10-CM | POA: Diagnosis not present

## 2018-01-27 MED FILL — AMOX-CLAV 875-125 MG TABLET: 875-125 | 4 days supply | Qty: 8 | Fill #0

## 2018-02-08 MED FILL — BENAZEPRIL HCL 40 MG TABLET: 40 | 30 days supply | Qty: 30 | Fill #2

## 2018-02-17 DIAGNOSIS — F329 Major depressive disorder, single episode, unspecified: Secondary | ICD-10-CM | POA: Diagnosis not present

## 2018-02-19 DIAGNOSIS — I10 Essential (primary) hypertension: Secondary | ICD-10-CM | POA: Diagnosis not present

## 2018-02-19 DIAGNOSIS — G4733 Obstructive sleep apnea (adult) (pediatric): Secondary | ICD-10-CM | POA: Diagnosis not present

## 2018-02-19 DIAGNOSIS — F17220 Nicotine dependence, chewing tobacco, uncomplicated: Secondary | ICD-10-CM | POA: Diagnosis not present

## 2018-02-19 DIAGNOSIS — J342 Deviated nasal septum: Secondary | ICD-10-CM | POA: Insufficient documentation

## 2018-02-19 DIAGNOSIS — J329 Chronic sinusitis, unspecified: Secondary | ICD-10-CM | POA: Diagnosis not present

## 2018-02-19 DIAGNOSIS — F329 Major depressive disorder, single episode, unspecified: Secondary | ICD-10-CM | POA: Diagnosis not present

## 2018-02-19 DIAGNOSIS — J328 Other chronic sinusitis: Secondary | ICD-10-CM | POA: Diagnosis not present

## 2018-02-19 DIAGNOSIS — J310 Chronic rhinitis: Secondary | ICD-10-CM | POA: Diagnosis not present

## 2018-02-19 DIAGNOSIS — Z6836 Body mass index (BMI) 36.0-36.9, adult: Secondary | ICD-10-CM | POA: Diagnosis not present

## 2018-02-19 DIAGNOSIS — J3489 Other specified disorders of nose and nasal sinuses: Secondary | ICD-10-CM | POA: Diagnosis not present

## 2018-03-08 MED FILL — BENAZEPRIL HCL 40 MG TABLET: 40 | 30 days supply | Qty: 30 | Fill #3

## 2018-03-18 DIAGNOSIS — J069 Acute upper respiratory infection, unspecified: Secondary | ICD-10-CM | POA: Diagnosis not present

## 2018-03-18 DIAGNOSIS — R05 Cough: Secondary | ICD-10-CM | POA: Diagnosis not present

## 2018-03-18 DIAGNOSIS — J019 Acute sinusitis, unspecified: Secondary | ICD-10-CM | POA: Diagnosis not present

## 2018-03-18 DIAGNOSIS — J310 Chronic rhinitis: Secondary | ICD-10-CM | POA: Diagnosis not present

## 2018-03-19 ENCOUNTER — Ambulatory Visit: Payer: BLUE CROSS/BLUE SHIELD | Admitting: Family Medicine

## 2018-03-29 ENCOUNTER — Telehealth: Payer: Self-pay | Admitting: Family Medicine

## 2018-03-29 NOTE — Telephone Encounter (Signed)
Patient called to make an appointment about blood pressure control, and refills. Call patient back to discuss.

## 2018-03-30 DIAGNOSIS — F329 Major depressive disorder, single episode, unspecified: Secondary | ICD-10-CM | POA: Diagnosis not present

## 2018-04-02 MED FILL — BENAZEPRIL HCL 40 MG TABLET: 40 | 90 days supply | Qty: 90 | Fill #0

## 2018-04-27 DIAGNOSIS — F329 Major depressive disorder, single episode, unspecified: Secondary | ICD-10-CM | POA: Diagnosis not present

## 2018-05-25 DIAGNOSIS — F329 Major depressive disorder, single episode, unspecified: Secondary | ICD-10-CM | POA: Diagnosis not present

## 2018-06-22 DIAGNOSIS — F329 Major depressive disorder, single episode, unspecified: Secondary | ICD-10-CM | POA: Diagnosis not present

## 2018-06-28 ENCOUNTER — Encounter: Payer: Self-pay | Admitting: Family Medicine

## 2018-06-28 ENCOUNTER — Ambulatory Visit: Payer: BC Managed Care – PPO | Admitting: Family Medicine

## 2018-06-28 ENCOUNTER — Other Ambulatory Visit: Payer: Self-pay

## 2018-06-28 VITALS — BP 126/72 | HR 68

## 2018-06-28 DIAGNOSIS — J328 Other chronic sinusitis: Secondary | ICD-10-CM

## 2018-06-28 DIAGNOSIS — I10 Essential (primary) hypertension: Secondary | ICD-10-CM | POA: Diagnosis not present

## 2018-06-28 MED ORDER — BENAZEPRIL HCL 40 MG PO TABS: 40.0000 mg | ORAL_TABLET | Freq: Every day | ORAL | 2 refills | Status: DC

## 2018-06-28 MED ORDER — AMOXICILLIN-POT CLAVULANATE 875-125 MG PO TABS: 1.0000 | ORAL_TABLET | Freq: Two times a day (BID) | ORAL | 0 refills | Status: DC

## 2018-06-28 MED FILL — BENAZEPRIL HCL 40 MG TABLET: 40 | 90 days supply | Qty: 90 | Fill #0

## 2018-06-28 MED FILL — AMOX-CLAV 875-125 MG TABLET: 875-125 | 10 days supply | Qty: 20 | Fill #0

## 2018-06-28 NOTE — Progress Notes (Signed)
   Subjective:    Patient ID: Charles Costa, male    DOB: 09/29/1965, 53 y.o.   MRN: [id number]   CC: Follow up for blood pressure   HPI: Patient is a 53 yo male who presents today to follow up on blood pressure management. Patient reports that he has been switched from Zestril (lisinopril) to Benazepril. He has had no issue with the transition and blood pressure has been well controlled. Patient also reports that he his schedule for nasal septum surgery on June 29 for recurrent sinusitis in the setting of septum deviation. Patient reports some nasal pressure and tenderness as well as mild ear pressure/congestion. He also endorse nasal drainage. Patient denies any ear pain, fever, chills, nausea or vomiting, no headaches, dizziness, chest pain, vision changes.  Smoking status reviewed   ROS: all other systems were reviewed and are negative other than in the HPI   Past Medical History:  Diagnosis Date  . Anxiety   . Depression   . GERD (gastroesophageal reflux disease)   . Hiatal hernia   . Insomnia   . Sleep apnea     Past Surgical History:  Procedure Laterality Date  . hydrocele    . KNEE ARTHROSCOPY      Past medical history, surgical, family, and social history reviewed and updated in the EMR as appropriate.  Objective:  BP 126/72   Pulse 68   SpO2 96%   Vitals and nursing note reviewed  General: NAD, pleasant, able to participate in exam HEENT:  Maxillary sinus tenderness L>R. Ear exam  TM visualized bilaterally, no erythema, bulging, fluid or pus noted.  Cardiac: RRR, normal heart sounds, no murmurs. 2+ radial and PT pulses bilaterally Respiratory: CTAB, normal effort, No wheezes, rales or rhonchi Abdomen: soft, nontender, nondistended, no hepatic or splenomegaly, +BS Extremities: no edema or cyanosis. WWP. Skin: warm and dry, no rashes noted Neuro: alert and oriented x4, no focal deficits Psych: Normal affect and mood   Assessment & Plan:   Benign  hypertension BP today 126/72. Patient at goal. Charles Costa continue current regimen with benazepril 40 mg daily.   Other chronic sinusitis Patient presents with maxillary sinus tenderness and ear pressure similar to past infection. Patient is afebrile with no signs of systemic infection such as fever. Given upcoming surgery will treat with Augmentin he will follow up with ENT this Friday 6/26.    Charles Skiff, MD [place] PGY-3

## 2018-06-28 NOTE — Assessment & Plan Note (Signed)
Patient presents with maxillary sinus tenderness and ear pressure similar to past infection. Patient is afebrile with no signs of systemic infection such as fever. Given upcoming surgery will treat with Augmentin he will follow up with ENT this Friday 6/26.

## 2018-06-28 NOTE — Assessment & Plan Note (Signed)
BP today 126/72. Patient at goal. Charles Costa continue current regimen with benazepril 40 mg daily.

## 2018-06-28 NOTE — Patient Instructions (Addendum)
It was great seeing you today! We have addressed the following issues today  1. I prescribed Augmentin for your sinus infection. Please take for 10 days.  If we did any lab work today, and the results require attention, either me or my nurse will get in touch with you. If everything is normal, you will get a letter in mail and a message via . If you don't hear from [place] in two weeks, please give [place] a call. Otherwise, we look forward to seeing you again at your next visit. If you have any questions or concerns before then, please call the clinic at [phone].  Please bring all your medications to every doctors visit  Sign up for My Chart to have easy access to your labs results, and communication with your Primary care physician. Please ask Front Desk for some assistance.   Please check-out at the front desk before leaving the clinic.    Take Care,   Dr. Andy Gauss

## 2018-07-02 DIAGNOSIS — Z1159 Encounter for screening for other viral diseases: Secondary | ICD-10-CM | POA: Diagnosis not present

## 2018-07-20 DIAGNOSIS — S31109A Unspecified open wound of abdominal wall, unspecified quadrant without penetration into peritoneal cavity, initial encounter: Secondary | ICD-10-CM | POA: Diagnosis not present

## 2018-07-20 DIAGNOSIS — S2231XA Fracture of one rib, right side, initial encounter for closed fracture: Secondary | ICD-10-CM | POA: Diagnosis not present

## 2018-07-20 DIAGNOSIS — K921 Melena: Secondary | ICD-10-CM | POA: Diagnosis not present

## 2018-07-20 DIAGNOSIS — S21141A Puncture wound with foreign body of right front wall of thorax without penetration into thoracic cavity, initial encounter: Secondary | ICD-10-CM | POA: Diagnosis not present

## 2018-07-20 DIAGNOSIS — Z781 Physical restraint status: Secondary | ICD-10-CM | POA: Diagnosis not present

## 2018-07-20 DIAGNOSIS — F29 Unspecified psychosis not due to a substance or known physiological condition: Secondary | ICD-10-CM | POA: Diagnosis not present

## 2018-07-20 DIAGNOSIS — F1320 Sedative, hypnotic or anxiolytic dependence, uncomplicated: Secondary | ICD-10-CM | POA: Diagnosis not present

## 2018-07-20 DIAGNOSIS — A419 Sepsis, unspecified organism: Secondary | ICD-10-CM | POA: Diagnosis not present

## 2018-07-20 DIAGNOSIS — R0689 Other abnormalities of breathing: Secondary | ICD-10-CM | POA: Diagnosis not present

## 2018-07-20 DIAGNOSIS — M795 Residual foreign body in soft tissue: Secondary | ICD-10-CM | POA: Diagnosis not present

## 2018-07-20 DIAGNOSIS — S31609A Unspecified open wound of abdominal wall, unspecified quadrant with penetration into peritoneal cavity, initial encounter: Secondary | ICD-10-CM | POA: Diagnosis not present

## 2018-07-20 DIAGNOSIS — E440 Moderate protein-calorie malnutrition: Secondary | ICD-10-CM | POA: Diagnosis not present

## 2018-07-20 DIAGNOSIS — T1491XA Suicide attempt, initial encounter: Secondary | ICD-10-CM | POA: Insufficient documentation

## 2018-07-20 DIAGNOSIS — R451 Restlessness and agitation: Secondary | ICD-10-CM | POA: Diagnosis not present

## 2018-07-20 DIAGNOSIS — Z4682 Encounter for fitting and adjustment of non-vascular catheter: Secondary | ICD-10-CM | POA: Diagnosis not present

## 2018-07-20 DIAGNOSIS — K6389 Other specified diseases of intestine: Secondary | ICD-10-CM | POA: Diagnosis not present

## 2018-07-20 DIAGNOSIS — W3400XA Accidental discharge from unspecified firearms or gun, initial encounter: Secondary | ICD-10-CM | POA: Diagnosis not present

## 2018-07-20 DIAGNOSIS — Z4803 Encounter for change or removal of drains: Secondary | ICD-10-CM | POA: Diagnosis not present

## 2018-07-20 DIAGNOSIS — R52 Pain, unspecified: Secondary | ICD-10-CM | POA: Diagnosis not present

## 2018-07-20 DIAGNOSIS — R140 Abdominal distension (gaseous): Secondary | ICD-10-CM | POA: Diagnosis not present

## 2018-07-20 DIAGNOSIS — K9189 Other postprocedural complications and disorders of digestive system: Secondary | ICD-10-CM | POA: Diagnosis not present

## 2018-07-20 DIAGNOSIS — J9811 Atelectasis: Secondary | ICD-10-CM | POA: Diagnosis not present

## 2018-07-20 DIAGNOSIS — T148XXA Other injury of unspecified body region, initial encounter: Secondary | ICD-10-CM | POA: Diagnosis not present

## 2018-07-20 DIAGNOSIS — D62 Acute posthemorrhagic anemia: Secondary | ICD-10-CM | POA: Diagnosis not present

## 2018-07-20 DIAGNOSIS — R0902 Hypoxemia: Secondary | ICD-10-CM | POA: Diagnosis not present

## 2018-07-20 DIAGNOSIS — F609 Personality disorder, unspecified: Secondary | ICD-10-CM | POA: Diagnosis not present

## 2018-07-20 DIAGNOSIS — K922 Gastrointestinal hemorrhage, unspecified: Secondary | ICD-10-CM | POA: Diagnosis not present

## 2018-07-20 DIAGNOSIS — R17 Unspecified jaundice: Secondary | ICD-10-CM | POA: Diagnosis not present

## 2018-07-20 DIAGNOSIS — J9600 Acute respiratory failure, unspecified whether with hypoxia or hypercapnia: Secondary | ICD-10-CM | POA: Diagnosis not present

## 2018-07-20 DIAGNOSIS — I10 Essential (primary) hypertension: Secondary | ICD-10-CM | POA: Diagnosis not present

## 2018-07-20 DIAGNOSIS — R918 Other nonspecific abnormal finding of lung field: Secondary | ICD-10-CM | POA: Diagnosis not present

## 2018-07-20 DIAGNOSIS — R40241 Glasgow coma scale score 13-15, unspecified time: Secondary | ICD-10-CM | POA: Diagnosis not present

## 2018-07-20 DIAGNOSIS — X749XXA Intentional self-harm by unspecified firearm discharge, initial encounter: Secondary | ICD-10-CM | POA: Diagnosis not present

## 2018-07-20 DIAGNOSIS — S31139A Puncture wound of abdominal wall without foreign body, unspecified quadrant without penetration into peritoneal cavity, initial encounter: Secondary | ICD-10-CM | POA: Diagnosis not present

## 2018-07-20 DIAGNOSIS — T182XXA Foreign body in stomach, initial encounter: Secondary | ICD-10-CM | POA: Diagnosis not present

## 2018-07-20 DIAGNOSIS — M7989 Other specified soft tissue disorders: Secondary | ICD-10-CM | POA: Diagnosis not present

## 2018-07-20 DIAGNOSIS — F419 Anxiety disorder, unspecified: Secondary | ICD-10-CM | POA: Diagnosis not present

## 2018-07-20 DIAGNOSIS — R339 Retention of urine, unspecified: Secondary | ICD-10-CM | POA: Diagnosis not present

## 2018-07-20 DIAGNOSIS — S21109A Unspecified open wound of unspecified front wall of thorax without penetration into thoracic cavity, initial encounter: Secondary | ICD-10-CM | POA: Diagnosis not present

## 2018-07-20 DIAGNOSIS — S31133A Puncture wound of abdominal wall without foreign body, right lower quadrant without penetration into peritoneal cavity, initial encounter: Secondary | ICD-10-CM | POA: Diagnosis not present

## 2018-07-20 DIAGNOSIS — K631 Perforation of intestine (nontraumatic): Secondary | ICD-10-CM | POA: Diagnosis not present

## 2018-07-20 DIAGNOSIS — S36591A Other injury of transverse colon, initial encounter: Secondary | ICD-10-CM | POA: Diagnosis not present

## 2018-07-20 DIAGNOSIS — E349 Endocrine disorder, unspecified: Secondary | ICD-10-CM | POA: Diagnosis not present

## 2018-07-20 DIAGNOSIS — J90 Pleural effusion, not elsewhere classified: Secondary | ICD-10-CM | POA: Diagnosis not present

## 2018-07-20 DIAGNOSIS — S36428A Contusion of other part of small intestine, initial encounter: Secondary | ICD-10-CM | POA: Diagnosis not present

## 2018-07-20 DIAGNOSIS — S21139A Puncture wound without foreign body of unspecified front wall of thorax without penetration into thoracic cavity, initial encounter: Secondary | ICD-10-CM | POA: Diagnosis not present

## 2018-07-20 DIAGNOSIS — R188 Other ascites: Secondary | ICD-10-CM | POA: Diagnosis not present

## 2018-07-20 DIAGNOSIS — R509 Fever, unspecified: Secondary | ICD-10-CM | POA: Diagnosis not present

## 2018-07-20 DIAGNOSIS — J189 Pneumonia, unspecified organism: Secondary | ICD-10-CM | POA: Diagnosis not present

## 2018-07-20 DIAGNOSIS — S31119A Laceration without foreign body of abdominal wall, unspecified quadrant without penetration into peritoneal cavity, initial encounter: Secondary | ICD-10-CM | POA: Diagnosis not present

## 2018-07-20 DIAGNOSIS — R000 Tachycardia, unspecified: Secondary | ICD-10-CM | POA: Diagnosis not present

## 2018-07-20 DIAGNOSIS — F418 Other specified anxiety disorders: Secondary | ICD-10-CM | POA: Diagnosis not present

## 2018-07-20 DIAGNOSIS — E871 Hypo-osmolality and hyponatremia: Secondary | ICD-10-CM | POA: Diagnosis not present

## 2018-07-20 DIAGNOSIS — Z9049 Acquired absence of other specified parts of digestive tract: Secondary | ICD-10-CM | POA: Diagnosis not present

## 2018-07-20 DIAGNOSIS — S36531A Laceration of transverse colon, initial encounter: Secondary | ICD-10-CM | POA: Diagnosis not present

## 2018-07-20 DIAGNOSIS — Z960 Presence of urogenital implants: Secondary | ICD-10-CM | POA: Diagnosis not present

## 2018-07-20 DIAGNOSIS — R5081 Fever presenting with conditions classified elsewhere: Secondary | ICD-10-CM | POA: Diagnosis not present

## 2018-07-20 DIAGNOSIS — T8189XA Other complications of procedures, not elsewhere classified, initial encounter: Secondary | ICD-10-CM | POA: Diagnosis not present

## 2018-07-20 DIAGNOSIS — K920 Hematemesis: Secondary | ICD-10-CM | POA: Diagnosis not present

## 2018-07-20 DIAGNOSIS — Y249XXA Unspecified firearm discharge, undetermined intent, initial encounter: Secondary | ICD-10-CM | POA: Diagnosis not present

## 2018-07-20 DIAGNOSIS — S298XXA Other specified injuries of thorax, initial encounter: Secondary | ICD-10-CM | POA: Diagnosis not present

## 2018-07-20 DIAGNOSIS — S31143A Puncture wound of abdominal wall with foreign body, right lower quadrant without penetration into peritoneal cavity, initial encounter: Secondary | ICD-10-CM | POA: Diagnosis not present

## 2018-07-20 DIAGNOSIS — Y998 Other external cause status: Secondary | ICD-10-CM | POA: Diagnosis not present

## 2018-07-20 DIAGNOSIS — F329 Major depressive disorder, single episode, unspecified: Secondary | ICD-10-CM | POA: Diagnosis not present

## 2018-07-20 DIAGNOSIS — K661 Hemoperitoneum: Secondary | ICD-10-CM | POA: Diagnosis not present

## 2018-07-20 DIAGNOSIS — G4733 Obstructive sleep apnea (adult) (pediatric): Secondary | ICD-10-CM | POA: Diagnosis not present

## 2018-07-20 DIAGNOSIS — E230 Hypopituitarism: Secondary | ICD-10-CM | POA: Diagnosis not present

## 2018-07-20 DIAGNOSIS — R1030 Lower abdominal pain, unspecified: Secondary | ICD-10-CM | POA: Diagnosis not present

## 2018-07-20 DIAGNOSIS — K651 Peritoneal abscess: Secondary | ICD-10-CM | POA: Diagnosis not present

## 2018-07-20 DIAGNOSIS — E291 Testicular hypofunction: Secondary | ICD-10-CM | POA: Diagnosis not present

## 2018-07-20 DIAGNOSIS — R9431 Abnormal electrocardiogram [ECG] [EKG]: Secondary | ICD-10-CM | POA: Diagnosis not present

## 2018-07-20 DIAGNOSIS — F1310 Sedative, hypnotic or anxiolytic abuse, uncomplicated: Secondary | ICD-10-CM | POA: Diagnosis not present

## 2018-08-31 DIAGNOSIS — L98493 Non-pressure chronic ulcer of skin of other sites with necrosis of muscle: Secondary | ICD-10-CM | POA: Diagnosis not present

## 2018-08-31 DIAGNOSIS — L98492 Non-pressure chronic ulcer of skin of other sites with fat layer exposed: Secondary | ICD-10-CM | POA: Diagnosis not present

## 2018-09-02 DIAGNOSIS — L98492 Non-pressure chronic ulcer of skin of other sites with fat layer exposed: Secondary | ICD-10-CM | POA: Diagnosis not present

## 2018-09-02 DIAGNOSIS — L98493 Non-pressure chronic ulcer of skin of other sites with necrosis of muscle: Secondary | ICD-10-CM | POA: Diagnosis not present

## 2018-09-03 DIAGNOSIS — F329 Major depressive disorder, single episode, unspecified: Secondary | ICD-10-CM | POA: Diagnosis not present

## 2018-09-06 DIAGNOSIS — L98493 Non-pressure chronic ulcer of skin of other sites with necrosis of muscle: Secondary | ICD-10-CM | POA: Diagnosis not present

## 2018-09-06 DIAGNOSIS — L98429 Non-pressure chronic ulcer of back with unspecified severity: Secondary | ICD-10-CM | POA: Diagnosis not present

## 2018-09-06 DIAGNOSIS — E46 Unspecified protein-calorie malnutrition: Secondary | ICD-10-CM | POA: Diagnosis not present

## 2018-09-06 DIAGNOSIS — G4733 Obstructive sleep apnea (adult) (pediatric): Secondary | ICD-10-CM | POA: Diagnosis not present

## 2018-09-06 DIAGNOSIS — W3400XA Accidental discharge from unspecified firearms or gun, initial encounter: Secondary | ICD-10-CM | POA: Diagnosis not present

## 2018-09-06 DIAGNOSIS — L98492 Non-pressure chronic ulcer of skin of other sites with fat layer exposed: Secondary | ICD-10-CM | POA: Diagnosis not present

## 2018-09-07 DIAGNOSIS — T8189XA Other complications of procedures, not elsewhere classified, initial encounter: Secondary | ICD-10-CM | POA: Diagnosis not present

## 2018-09-07 DIAGNOSIS — X58XXXA Exposure to other specified factors, initial encounter: Secondary | ICD-10-CM | POA: Diagnosis not present

## 2018-09-07 DIAGNOSIS — D509 Iron deficiency anemia, unspecified: Secondary | ICD-10-CM | POA: Insufficient documentation

## 2018-09-07 DIAGNOSIS — E440 Moderate protein-calorie malnutrition: Secondary | ICD-10-CM | POA: Diagnosis not present

## 2018-09-07 DIAGNOSIS — K922 Gastrointestinal hemorrhage, unspecified: Secondary | ICD-10-CM | POA: Diagnosis not present

## 2018-09-07 DIAGNOSIS — T8143XA Infection following a procedure, organ and space surgical site, initial encounter: Secondary | ICD-10-CM | POA: Diagnosis not present

## 2018-09-07 DIAGNOSIS — D62 Acute posthemorrhagic anemia: Secondary | ICD-10-CM | POA: Diagnosis not present

## 2018-09-07 DIAGNOSIS — K632 Fistula of intestine: Secondary | ICD-10-CM | POA: Diagnosis not present

## 2018-09-07 DIAGNOSIS — X749XXA Intentional self-harm by unspecified firearm discharge, initial encounter: Secondary | ICD-10-CM | POA: Diagnosis not present

## 2018-09-08 DIAGNOSIS — F329 Major depressive disorder, single episode, unspecified: Secondary | ICD-10-CM | POA: Diagnosis not present

## 2018-09-08 DIAGNOSIS — X749XXA Intentional self-harm by unspecified firearm discharge, initial encounter: Secondary | ICD-10-CM | POA: Diagnosis not present

## 2018-09-08 DIAGNOSIS — T8189XA Other complications of procedures, not elsewhere classified, initial encounter: Secondary | ICD-10-CM | POA: Diagnosis not present

## 2018-09-09 DIAGNOSIS — T8189XA Other complications of procedures, not elsewhere classified, initial encounter: Secondary | ICD-10-CM | POA: Diagnosis not present

## 2018-09-09 DIAGNOSIS — X749XXA Intentional self-harm by unspecified firearm discharge, initial encounter: Secondary | ICD-10-CM | POA: Diagnosis not present

## 2018-09-10 DIAGNOSIS — T8189XA Other complications of procedures, not elsewhere classified, initial encounter: Secondary | ICD-10-CM | POA: Diagnosis not present

## 2018-09-10 DIAGNOSIS — X749XXA Intentional self-harm by unspecified firearm discharge, initial encounter: Secondary | ICD-10-CM | POA: Diagnosis not present

## 2018-09-11 DIAGNOSIS — T8189XA Other complications of procedures, not elsewhere classified, initial encounter: Secondary | ICD-10-CM | POA: Diagnosis not present

## 2018-09-11 DIAGNOSIS — X749XXA Intentional self-harm by unspecified firearm discharge, initial encounter: Secondary | ICD-10-CM | POA: Diagnosis not present

## 2018-09-12 DIAGNOSIS — T8189XA Other complications of procedures, not elsewhere classified, initial encounter: Secondary | ICD-10-CM | POA: Diagnosis not present

## 2018-09-12 DIAGNOSIS — X749XXA Intentional self-harm by unspecified firearm discharge, initial encounter: Secondary | ICD-10-CM | POA: Diagnosis not present

## 2018-09-13 DIAGNOSIS — X749XXA Intentional self-harm by unspecified firearm discharge, initial encounter: Secondary | ICD-10-CM | POA: Diagnosis not present

## 2018-09-13 DIAGNOSIS — T8189XA Other complications of procedures, not elsewhere classified, initial encounter: Secondary | ICD-10-CM | POA: Diagnosis not present

## 2018-09-14 DIAGNOSIS — K632 Fistula of intestine: Secondary | ICD-10-CM | POA: Diagnosis not present

## 2018-09-14 DIAGNOSIS — L98493 Non-pressure chronic ulcer of skin of other sites with necrosis of muscle: Secondary | ICD-10-CM | POA: Diagnosis not present

## 2018-09-14 DIAGNOSIS — X749XXA Intentional self-harm by unspecified firearm discharge, initial encounter: Secondary | ICD-10-CM | POA: Diagnosis not present

## 2018-09-14 DIAGNOSIS — F329 Major depressive disorder, single episode, unspecified: Secondary | ICD-10-CM | POA: Diagnosis not present

## 2018-09-14 DIAGNOSIS — Y838 Other surgical procedures as the cause of abnormal reaction of the patient, or of later complication, without mention of misadventure at the time of the procedure: Secondary | ICD-10-CM | POA: Diagnosis not present

## 2018-09-14 DIAGNOSIS — L98492 Non-pressure chronic ulcer of skin of other sites with fat layer exposed: Secondary | ICD-10-CM | POA: Diagnosis not present

## 2018-09-14 DIAGNOSIS — T8143XA Infection following a procedure, organ and space surgical site, initial encounter: Secondary | ICD-10-CM | POA: Diagnosis not present

## 2018-09-14 DIAGNOSIS — T8189XA Other complications of procedures, not elsewhere classified, initial encounter: Secondary | ICD-10-CM | POA: Diagnosis not present

## 2018-09-15 DIAGNOSIS — X749XXA Intentional self-harm by unspecified firearm discharge, initial encounter: Secondary | ICD-10-CM | POA: Diagnosis not present

## 2018-09-15 DIAGNOSIS — T8189XA Other complications of procedures, not elsewhere classified, initial encounter: Secondary | ICD-10-CM | POA: Diagnosis not present

## 2018-09-15 DIAGNOSIS — F329 Major depressive disorder, single episode, unspecified: Secondary | ICD-10-CM | POA: Diagnosis not present

## 2018-09-16 DIAGNOSIS — T8189XA Other complications of procedures, not elsewhere classified, initial encounter: Secondary | ICD-10-CM | POA: Diagnosis not present

## 2018-09-16 DIAGNOSIS — X749XXA Intentional self-harm by unspecified firearm discharge, initial encounter: Secondary | ICD-10-CM | POA: Diagnosis not present

## 2018-09-17 DIAGNOSIS — X749XXA Intentional self-harm by unspecified firearm discharge, initial encounter: Secondary | ICD-10-CM | POA: Diagnosis not present

## 2018-09-17 DIAGNOSIS — T8189XA Other complications of procedures, not elsewhere classified, initial encounter: Secondary | ICD-10-CM | POA: Diagnosis not present

## 2018-09-18 DIAGNOSIS — T8189XA Other complications of procedures, not elsewhere classified, initial encounter: Secondary | ICD-10-CM | POA: Diagnosis not present

## 2018-09-18 DIAGNOSIS — X749XXA Intentional self-harm by unspecified firearm discharge, initial encounter: Secondary | ICD-10-CM | POA: Diagnosis not present

## 2018-09-19 DIAGNOSIS — T8189XA Other complications of procedures, not elsewhere classified, initial encounter: Secondary | ICD-10-CM | POA: Diagnosis not present

## 2018-09-19 DIAGNOSIS — X749XXA Intentional self-harm by unspecified firearm discharge, initial encounter: Secondary | ICD-10-CM | POA: Diagnosis not present

## 2018-09-20 DIAGNOSIS — L98492 Non-pressure chronic ulcer of skin of other sites with fat layer exposed: Secondary | ICD-10-CM | POA: Diagnosis not present

## 2018-09-20 DIAGNOSIS — E46 Unspecified protein-calorie malnutrition: Secondary | ICD-10-CM | POA: Diagnosis not present

## 2018-09-20 DIAGNOSIS — T8189XA Other complications of procedures, not elsewhere classified, initial encounter: Secondary | ICD-10-CM | POA: Diagnosis not present

## 2018-09-20 DIAGNOSIS — L98429 Non-pressure chronic ulcer of back with unspecified severity: Secondary | ICD-10-CM | POA: Diagnosis not present

## 2018-09-20 DIAGNOSIS — X749XXA Intentional self-harm by unspecified firearm discharge, initial encounter: Secondary | ICD-10-CM | POA: Diagnosis not present

## 2018-09-20 DIAGNOSIS — W3400XA Accidental discharge from unspecified firearms or gun, initial encounter: Secondary | ICD-10-CM | POA: Diagnosis not present

## 2018-09-21 DIAGNOSIS — L98492 Non-pressure chronic ulcer of skin of other sites with fat layer exposed: Secondary | ICD-10-CM | POA: Diagnosis not present

## 2018-09-21 DIAGNOSIS — T8189XA Other complications of procedures, not elsewhere classified, initial encounter: Secondary | ICD-10-CM | POA: Diagnosis not present

## 2018-09-21 DIAGNOSIS — X749XXA Intentional self-harm by unspecified firearm discharge, initial encounter: Secondary | ICD-10-CM | POA: Diagnosis not present

## 2018-09-22 DIAGNOSIS — F329 Major depressive disorder, single episode, unspecified: Secondary | ICD-10-CM | POA: Diagnosis not present

## 2018-09-22 DIAGNOSIS — T8189XA Other complications of procedures, not elsewhere classified, initial encounter: Secondary | ICD-10-CM | POA: Diagnosis not present

## 2018-09-22 DIAGNOSIS — X749XXA Intentional self-harm by unspecified firearm discharge, initial encounter: Secondary | ICD-10-CM | POA: Diagnosis not present

## 2018-09-23 DIAGNOSIS — L98492 Non-pressure chronic ulcer of skin of other sites with fat layer exposed: Secondary | ICD-10-CM | POA: Diagnosis not present

## 2018-09-23 DIAGNOSIS — X749XXA Intentional self-harm by unspecified firearm discharge, initial encounter: Secondary | ICD-10-CM | POA: Diagnosis not present

## 2018-09-23 DIAGNOSIS — T8189XA Other complications of procedures, not elsewhere classified, initial encounter: Secondary | ICD-10-CM | POA: Diagnosis not present

## 2018-09-23 DIAGNOSIS — L98493 Non-pressure chronic ulcer of skin of other sites with necrosis of muscle: Secondary | ICD-10-CM | POA: Diagnosis not present

## 2018-09-24 DIAGNOSIS — X749XXA Intentional self-harm by unspecified firearm discharge, initial encounter: Secondary | ICD-10-CM | POA: Diagnosis not present

## 2018-09-24 DIAGNOSIS — T8189XA Other complications of procedures, not elsewhere classified, initial encounter: Secondary | ICD-10-CM | POA: Diagnosis not present

## 2018-09-25 DIAGNOSIS — X749XXA Intentional self-harm by unspecified firearm discharge, initial encounter: Secondary | ICD-10-CM | POA: Diagnosis not present

## 2018-09-25 DIAGNOSIS — T8189XA Other complications of procedures, not elsewhere classified, initial encounter: Secondary | ICD-10-CM | POA: Diagnosis not present

## 2018-09-27 DIAGNOSIS — L98493 Non-pressure chronic ulcer of skin of other sites with necrosis of muscle: Secondary | ICD-10-CM | POA: Diagnosis not present

## 2018-09-27 DIAGNOSIS — L98492 Non-pressure chronic ulcer of skin of other sites with fat layer exposed: Secondary | ICD-10-CM | POA: Diagnosis not present

## 2018-09-27 DIAGNOSIS — L02211 Cutaneous abscess of abdominal wall: Secondary | ICD-10-CM | POA: Diagnosis not present

## 2018-09-28 DIAGNOSIS — F329 Major depressive disorder, single episode, unspecified: Secondary | ICD-10-CM | POA: Diagnosis not present

## 2018-09-29 DIAGNOSIS — K651 Peritoneal abscess: Secondary | ICD-10-CM | POA: Diagnosis not present

## 2018-09-29 DIAGNOSIS — Z4682 Encounter for fitting and adjustment of non-vascular catheter: Secondary | ICD-10-CM | POA: Diagnosis not present

## 2018-09-30 DIAGNOSIS — L98492 Non-pressure chronic ulcer of skin of other sites with fat layer exposed: Secondary | ICD-10-CM | POA: Diagnosis not present

## 2018-09-30 DIAGNOSIS — L98493 Non-pressure chronic ulcer of skin of other sites with necrosis of muscle: Secondary | ICD-10-CM | POA: Diagnosis not present

## 2018-09-30 DIAGNOSIS — L02211 Cutaneous abscess of abdominal wall: Secondary | ICD-10-CM | POA: Diagnosis not present

## 2018-10-04 DIAGNOSIS — B9562 Methicillin resistant Staphylococcus aureus infection as the cause of diseases classified elsewhere: Secondary | ICD-10-CM | POA: Diagnosis not present

## 2018-10-04 DIAGNOSIS — B9620 Unspecified Escherichia coli [E. coli] as the cause of diseases classified elsewhere: Secondary | ICD-10-CM | POA: Diagnosis not present

## 2018-10-04 DIAGNOSIS — F419 Anxiety disorder, unspecified: Secondary | ICD-10-CM | POA: Diagnosis not present

## 2018-10-04 DIAGNOSIS — K6811 Postprocedural retroperitoneal abscess: Secondary | ICD-10-CM | POA: Diagnosis not present

## 2018-10-04 DIAGNOSIS — T85698A Other mechanical complication of other specified internal prosthetic devices, implants and grafts, initial encounter: Secondary | ICD-10-CM | POA: Diagnosis not present

## 2018-10-04 DIAGNOSIS — F17290 Nicotine dependence, other tobacco product, uncomplicated: Secondary | ICD-10-CM | POA: Diagnosis not present

## 2018-10-04 DIAGNOSIS — T300 Burn of unspecified body region, unspecified degree: Secondary | ICD-10-CM | POA: Diagnosis not present

## 2018-10-04 DIAGNOSIS — L0291 Cutaneous abscess, unspecified: Secondary | ICD-10-CM | POA: Diagnosis not present

## 2018-10-04 DIAGNOSIS — F329 Major depressive disorder, single episode, unspecified: Secondary | ICD-10-CM | POA: Diagnosis not present

## 2018-10-04 DIAGNOSIS — A419 Sepsis, unspecified organism: Secondary | ICD-10-CM | POA: Diagnosis not present

## 2018-10-04 DIAGNOSIS — T8149XA Infection following a procedure, other surgical site, initial encounter: Secondary | ICD-10-CM | POA: Diagnosis not present

## 2018-10-04 DIAGNOSIS — R0902 Hypoxemia: Secondary | ICD-10-CM | POA: Diagnosis not present

## 2018-10-04 DIAGNOSIS — R509 Fever, unspecified: Secondary | ICD-10-CM | POA: Diagnosis not present

## 2018-10-04 DIAGNOSIS — Y249XXA Unspecified firearm discharge, undetermined intent, initial encounter: Secondary | ICD-10-CM | POA: Diagnosis not present

## 2018-10-04 DIAGNOSIS — Z9889 Other specified postprocedural states: Secondary | ICD-10-CM | POA: Diagnosis not present

## 2018-10-04 DIAGNOSIS — L02211 Cutaneous abscess of abdominal wall: Secondary | ICD-10-CM | POA: Diagnosis not present

## 2018-10-04 DIAGNOSIS — K660 Peritoneal adhesions (postprocedural) (postinfection): Secondary | ICD-10-CM | POA: Diagnosis not present

## 2018-10-04 DIAGNOSIS — R000 Tachycardia, unspecified: Secondary | ICD-10-CM | POA: Diagnosis not present

## 2018-10-04 DIAGNOSIS — L98492 Non-pressure chronic ulcer of skin of other sites with fat layer exposed: Secondary | ICD-10-CM | POA: Diagnosis not present

## 2018-10-04 DIAGNOSIS — E860 Dehydration: Secondary | ICD-10-CM | POA: Diagnosis not present

## 2018-10-04 DIAGNOSIS — R1084 Generalized abdominal pain: Secondary | ICD-10-CM | POA: Diagnosis not present

## 2018-10-04 DIAGNOSIS — I10 Essential (primary) hypertension: Secondary | ICD-10-CM | POA: Diagnosis not present

## 2018-10-04 DIAGNOSIS — L98429 Non-pressure chronic ulcer of back with unspecified severity: Secondary | ICD-10-CM | POA: Diagnosis not present

## 2018-10-04 DIAGNOSIS — B954 Other streptococcus as the cause of diseases classified elsewhere: Secondary | ICD-10-CM | POA: Diagnosis not present

## 2018-10-04 DIAGNOSIS — R52 Pain, unspecified: Secondary | ICD-10-CM | POA: Diagnosis not present

## 2018-10-04 DIAGNOSIS — K651 Peritoneal abscess: Secondary | ICD-10-CM | POA: Diagnosis not present

## 2018-10-04 DIAGNOSIS — E785 Hyperlipidemia, unspecified: Secondary | ICD-10-CM | POA: Diagnosis not present

## 2018-10-04 DIAGNOSIS — S21209A Unspecified open wound of unspecified back wall of thorax without penetration into thoracic cavity, initial encounter: Secondary | ICD-10-CM | POA: Diagnosis not present

## 2018-10-04 DIAGNOSIS — R7989 Other specified abnormal findings of blood chemistry: Secondary | ICD-10-CM | POA: Diagnosis not present

## 2018-10-04 DIAGNOSIS — G4733 Obstructive sleep apnea (adult) (pediatric): Secondary | ICD-10-CM | POA: Diagnosis not present

## 2018-10-04 DIAGNOSIS — F17210 Nicotine dependence, cigarettes, uncomplicated: Secondary | ICD-10-CM | POA: Diagnosis not present

## 2018-10-04 DIAGNOSIS — R918 Other nonspecific abnormal finding of lung field: Secondary | ICD-10-CM | POA: Diagnosis not present

## 2018-10-04 DIAGNOSIS — K529 Noninfective gastroenteritis and colitis, unspecified: Secondary | ICD-10-CM | POA: Diagnosis not present

## 2018-10-04 DIAGNOSIS — Z4803 Encounter for change or removal of drains: Secondary | ICD-10-CM | POA: Diagnosis not present

## 2018-10-04 DIAGNOSIS — J90 Pleural effusion, not elsewhere classified: Secondary | ICD-10-CM | POA: Diagnosis not present

## 2018-10-06 DIAGNOSIS — F329 Major depressive disorder, single episode, unspecified: Secondary | ICD-10-CM | POA: Diagnosis not present

## 2018-10-07 DIAGNOSIS — T8149XA Infection following a procedure, other surgical site, initial encounter: Secondary | ICD-10-CM | POA: Insufficient documentation

## 2018-10-07 DIAGNOSIS — G969 Disorder of central nervous system, unspecified: Secondary | ICD-10-CM | POA: Insufficient documentation

## 2018-10-07 HISTORY — DX: Infection following a procedure, other surgical site, initial encounter: T81.49XA

## 2018-10-08 DIAGNOSIS — F329 Major depressive disorder, single episode, unspecified: Secondary | ICD-10-CM | POA: Diagnosis not present

## 2018-10-11 DIAGNOSIS — F329 Major depressive disorder, single episode, unspecified: Secondary | ICD-10-CM | POA: Diagnosis not present

## 2018-10-12 DIAGNOSIS — K651 Peritoneal abscess: Secondary | ICD-10-CM | POA: Diagnosis not present

## 2018-10-13 DIAGNOSIS — F329 Major depressive disorder, single episode, unspecified: Secondary | ICD-10-CM | POA: Diagnosis not present

## 2018-10-14 DIAGNOSIS — W3400XA Accidental discharge from unspecified firearms or gun, initial encounter: Secondary | ICD-10-CM | POA: Diagnosis not present

## 2018-10-14 DIAGNOSIS — L98492 Non-pressure chronic ulcer of skin of other sites with fat layer exposed: Secondary | ICD-10-CM | POA: Diagnosis not present

## 2018-10-14 DIAGNOSIS — L02211 Cutaneous abscess of abdominal wall: Secondary | ICD-10-CM | POA: Diagnosis not present

## 2018-10-14 DIAGNOSIS — L98429 Non-pressure chronic ulcer of back with unspecified severity: Secondary | ICD-10-CM | POA: Diagnosis not present

## 2018-10-14 DIAGNOSIS — E46 Unspecified protein-calorie malnutrition: Secondary | ICD-10-CM | POA: Diagnosis not present

## 2018-10-15 DIAGNOSIS — F329 Major depressive disorder, single episode, unspecified: Secondary | ICD-10-CM | POA: Diagnosis not present

## 2018-10-19 DIAGNOSIS — L98492 Non-pressure chronic ulcer of skin of other sites with fat layer exposed: Secondary | ICD-10-CM | POA: Diagnosis not present

## 2018-10-19 DIAGNOSIS — L98493 Non-pressure chronic ulcer of skin of other sites with necrosis of muscle: Secondary | ICD-10-CM | POA: Diagnosis not present

## 2018-10-20 DIAGNOSIS — F419 Anxiety disorder, unspecified: Secondary | ICD-10-CM | POA: Diagnosis not present

## 2018-10-20 DIAGNOSIS — R531 Weakness: Secondary | ICD-10-CM | POA: Diagnosis not present

## 2018-10-20 DIAGNOSIS — I10 Essential (primary) hypertension: Secondary | ICD-10-CM | POA: Diagnosis not present

## 2018-10-20 DIAGNOSIS — R1030 Lower abdominal pain, unspecified: Secondary | ICD-10-CM | POA: Diagnosis not present

## 2018-10-21 DIAGNOSIS — Z09 Encounter for follow-up examination after completed treatment for conditions other than malignant neoplasm: Secondary | ICD-10-CM | POA: Diagnosis not present

## 2018-10-21 DIAGNOSIS — A499 Bacterial infection, unspecified: Secondary | ICD-10-CM | POA: Diagnosis not present

## 2018-10-21 DIAGNOSIS — K651 Peritoneal abscess: Secondary | ICD-10-CM | POA: Diagnosis not present

## 2018-10-21 DIAGNOSIS — A488 Other specified bacterial diseases: Secondary | ICD-10-CM | POA: Diagnosis not present

## 2018-10-21 DIAGNOSIS — I10 Essential (primary) hypertension: Secondary | ICD-10-CM | POA: Diagnosis not present

## 2018-10-21 DIAGNOSIS — E785 Hyperlipidemia, unspecified: Secondary | ICD-10-CM | POA: Diagnosis not present

## 2018-10-21 DIAGNOSIS — Z79899 Other long term (current) drug therapy: Secondary | ICD-10-CM | POA: Diagnosis not present

## 2018-10-21 DIAGNOSIS — G4733 Obstructive sleep apnea (adult) (pediatric): Secondary | ICD-10-CM | POA: Diagnosis not present

## 2018-10-21 DIAGNOSIS — R42 Dizziness and giddiness: Secondary | ICD-10-CM | POA: Diagnosis not present

## 2018-10-21 DIAGNOSIS — F329 Major depressive disorder, single episode, unspecified: Secondary | ICD-10-CM | POA: Diagnosis not present

## 2018-10-22 DIAGNOSIS — S3991XD Unspecified injury of abdomen, subsequent encounter: Secondary | ICD-10-CM | POA: Diagnosis not present

## 2018-10-22 DIAGNOSIS — Z4682 Encounter for fitting and adjustment of non-vascular catheter: Secondary | ICD-10-CM | POA: Diagnosis not present

## 2018-10-22 DIAGNOSIS — F329 Major depressive disorder, single episode, unspecified: Secondary | ICD-10-CM | POA: Diagnosis not present

## 2018-10-22 DIAGNOSIS — X749XXD Intentional self-harm by unspecified firearm discharge, subsequent encounter: Secondary | ICD-10-CM | POA: Diagnosis not present

## 2018-10-25 DIAGNOSIS — R55 Syncope and collapse: Secondary | ICD-10-CM | POA: Diagnosis not present

## 2018-10-25 DIAGNOSIS — R2681 Unsteadiness on feet: Secondary | ICD-10-CM | POA: Diagnosis not present

## 2018-10-25 DIAGNOSIS — R42 Dizziness and giddiness: Secondary | ICD-10-CM | POA: Diagnosis not present

## 2018-10-25 DIAGNOSIS — R0602 Shortness of breath: Secondary | ICD-10-CM | POA: Diagnosis not present

## 2018-10-26 DIAGNOSIS — W3400XA Accidental discharge from unspecified firearms or gun, initial encounter: Secondary | ICD-10-CM | POA: Diagnosis not present

## 2018-10-26 DIAGNOSIS — L98429 Non-pressure chronic ulcer of back with unspecified severity: Secondary | ICD-10-CM | POA: Diagnosis not present

## 2018-10-26 DIAGNOSIS — L98492 Non-pressure chronic ulcer of skin of other sites with fat layer exposed: Secondary | ICD-10-CM | POA: Diagnosis not present

## 2018-10-26 DIAGNOSIS — L98493 Non-pressure chronic ulcer of skin of other sites with necrosis of muscle: Secondary | ICD-10-CM | POA: Diagnosis not present

## 2018-10-27 DIAGNOSIS — F329 Major depressive disorder, single episode, unspecified: Secondary | ICD-10-CM | POA: Diagnosis not present

## 2018-10-28 DIAGNOSIS — L98429 Non-pressure chronic ulcer of back with unspecified severity: Secondary | ICD-10-CM | POA: Diagnosis not present

## 2018-10-28 DIAGNOSIS — W3400XA Accidental discharge from unspecified firearms or gun, initial encounter: Secondary | ICD-10-CM | POA: Diagnosis not present

## 2018-10-28 DIAGNOSIS — L98492 Non-pressure chronic ulcer of skin of other sites with fat layer exposed: Secondary | ICD-10-CM | POA: Diagnosis not present

## 2018-10-28 DIAGNOSIS — L98493 Non-pressure chronic ulcer of skin of other sites with necrosis of muscle: Secondary | ICD-10-CM | POA: Diagnosis not present

## 2018-10-29 DIAGNOSIS — F329 Major depressive disorder, single episode, unspecified: Secondary | ICD-10-CM | POA: Diagnosis not present

## 2018-11-01 DIAGNOSIS — F329 Major depressive disorder, single episode, unspecified: Secondary | ICD-10-CM | POA: Diagnosis not present

## 2018-11-02 DIAGNOSIS — S31109A Unspecified open wound of abdominal wall, unspecified quadrant without penetration into peritoneal cavity, initial encounter: Secondary | ICD-10-CM | POA: Diagnosis not present

## 2018-11-02 DIAGNOSIS — X731XXS Intentional self-harm by hunting rifle discharge, sequela: Secondary | ICD-10-CM | POA: Diagnosis not present

## 2018-11-02 DIAGNOSIS — Z4803 Encounter for change or removal of drains: Secondary | ICD-10-CM | POA: Diagnosis not present

## 2018-11-02 DIAGNOSIS — Y838 Other surgical procedures as the cause of abnormal reaction of the patient, or of later complication, without mention of misadventure at the time of the procedure: Secondary | ICD-10-CM | POA: Diagnosis not present

## 2018-11-02 DIAGNOSIS — T8143XD Infection following a procedure, organ and space surgical site, subsequent encounter: Secondary | ICD-10-CM | POA: Diagnosis not present

## 2018-11-02 DIAGNOSIS — S31139S Puncture wound of abdominal wall without foreign body, unspecified quadrant without penetration into peritoneal cavity, sequela: Secondary | ICD-10-CM | POA: Diagnosis not present

## 2018-11-02 DIAGNOSIS — R410 Disorientation, unspecified: Secondary | ICD-10-CM | POA: Diagnosis not present

## 2018-11-02 DIAGNOSIS — T8143XA Infection following a procedure, organ and space surgical site, initial encounter: Secondary | ICD-10-CM | POA: Diagnosis not present

## 2018-11-03 DIAGNOSIS — F329 Major depressive disorder, single episode, unspecified: Secondary | ICD-10-CM | POA: Diagnosis not present

## 2018-11-04 DIAGNOSIS — L98493 Non-pressure chronic ulcer of skin of other sites with necrosis of muscle: Secondary | ICD-10-CM | POA: Diagnosis not present

## 2018-11-04 DIAGNOSIS — F419 Anxiety disorder, unspecified: Secondary | ICD-10-CM | POA: Diagnosis not present

## 2018-11-04 DIAGNOSIS — R072 Precordial pain: Secondary | ICD-10-CM | POA: Diagnosis not present

## 2018-11-04 DIAGNOSIS — L98429 Non-pressure chronic ulcer of back with unspecified severity: Secondary | ICD-10-CM | POA: Diagnosis not present

## 2018-11-04 DIAGNOSIS — L98492 Non-pressure chronic ulcer of skin of other sites with fat layer exposed: Secondary | ICD-10-CM | POA: Diagnosis not present

## 2018-11-04 DIAGNOSIS — R000 Tachycardia, unspecified: Secondary | ICD-10-CM | POA: Diagnosis not present

## 2018-11-04 DIAGNOSIS — W3400XA Accidental discharge from unspecified firearms or gun, initial encounter: Secondary | ICD-10-CM | POA: Diagnosis not present

## 2018-11-04 DIAGNOSIS — R0602 Shortness of breath: Secondary | ICD-10-CM | POA: Diagnosis not present

## 2018-11-05 DIAGNOSIS — F329 Major depressive disorder, single episode, unspecified: Secondary | ICD-10-CM | POA: Diagnosis not present

## 2018-11-05 DIAGNOSIS — R072 Precordial pain: Secondary | ICD-10-CM | POA: Diagnosis not present

## 2018-11-05 DIAGNOSIS — R000 Tachycardia, unspecified: Secondary | ICD-10-CM | POA: Diagnosis not present

## 2018-11-08 DIAGNOSIS — F329 Major depressive disorder, single episode, unspecified: Secondary | ICD-10-CM | POA: Diagnosis not present

## 2018-11-09 DIAGNOSIS — F329 Major depressive disorder, single episode, unspecified: Secondary | ICD-10-CM | POA: Diagnosis not present

## 2018-11-09 DIAGNOSIS — G4730 Sleep apnea, unspecified: Secondary | ICD-10-CM | POA: Diagnosis not present

## 2018-11-09 DIAGNOSIS — F419 Anxiety disorder, unspecified: Secondary | ICD-10-CM | POA: Diagnosis not present

## 2018-11-09 DIAGNOSIS — I10 Essential (primary) hypertension: Secondary | ICD-10-CM | POA: Diagnosis not present

## 2018-11-10 DIAGNOSIS — F329 Major depressive disorder, single episode, unspecified: Secondary | ICD-10-CM | POA: Diagnosis not present

## 2018-11-11 DIAGNOSIS — E278 Other specified disorders of adrenal gland: Secondary | ICD-10-CM | POA: Diagnosis not present

## 2018-11-11 DIAGNOSIS — R58 Hemorrhage, not elsewhere classified: Secondary | ICD-10-CM | POA: Diagnosis not present

## 2018-11-11 DIAGNOSIS — G4733 Obstructive sleep apnea (adult) (pediatric): Secondary | ICD-10-CM | POA: Diagnosis not present

## 2018-11-11 DIAGNOSIS — R1084 Generalized abdominal pain: Secondary | ICD-10-CM | POA: Diagnosis not present

## 2018-11-12 DIAGNOSIS — F329 Major depressive disorder, single episode, unspecified: Secondary | ICD-10-CM | POA: Diagnosis not present

## 2018-11-12 DIAGNOSIS — R58 Hemorrhage, not elsewhere classified: Secondary | ICD-10-CM | POA: Diagnosis not present

## 2018-11-12 DIAGNOSIS — E278 Other specified disorders of adrenal gland: Secondary | ICD-10-CM | POA: Diagnosis not present

## 2018-11-15 MED FILL — BENAZEPRIL HCL 40 MG TABLET: 40 | 90 days supply | Qty: 90 | Fill #1

## 2018-11-16 DIAGNOSIS — A499 Bacterial infection, unspecified: Secondary | ICD-10-CM | POA: Diagnosis not present

## 2018-11-16 DIAGNOSIS — K651 Peritoneal abscess: Secondary | ICD-10-CM | POA: Diagnosis not present

## 2018-11-16 DIAGNOSIS — Z5189 Encounter for other specified aftercare: Secondary | ICD-10-CM | POA: Diagnosis not present

## 2018-11-16 DIAGNOSIS — L988 Other specified disorders of the skin and subcutaneous tissue: Secondary | ICD-10-CM | POA: Diagnosis not present

## 2018-11-17 DIAGNOSIS — F329 Major depressive disorder, single episode, unspecified: Secondary | ICD-10-CM | POA: Diagnosis not present

## 2018-11-23 DIAGNOSIS — T8143XA Infection following a procedure, organ and space surgical site, initial encounter: Secondary | ICD-10-CM | POA: Diagnosis not present

## 2018-11-23 DIAGNOSIS — G4730 Sleep apnea, unspecified: Secondary | ICD-10-CM | POA: Diagnosis not present

## 2018-11-23 DIAGNOSIS — Z4803 Encounter for change or removal of drains: Secondary | ICD-10-CM | POA: Diagnosis not present

## 2018-11-23 DIAGNOSIS — R410 Disorientation, unspecified: Secondary | ICD-10-CM | POA: Diagnosis not present

## 2018-11-23 DIAGNOSIS — X58XXXA Exposure to other specified factors, initial encounter: Secondary | ICD-10-CM | POA: Diagnosis not present

## 2018-11-25 DIAGNOSIS — L98492 Non-pressure chronic ulcer of skin of other sites with fat layer exposed: Secondary | ICD-10-CM | POA: Diagnosis not present

## 2018-11-25 DIAGNOSIS — W3400XA Accidental discharge from unspecified firearms or gun, initial encounter: Secondary | ICD-10-CM | POA: Diagnosis not present

## 2018-11-25 DIAGNOSIS — L98493 Non-pressure chronic ulcer of skin of other sites with necrosis of muscle: Secondary | ICD-10-CM | POA: Diagnosis not present

## 2018-11-25 DIAGNOSIS — L98429 Non-pressure chronic ulcer of back with unspecified severity: Secondary | ICD-10-CM | POA: Diagnosis not present

## 2018-11-26 DIAGNOSIS — F329 Major depressive disorder, single episode, unspecified: Secondary | ICD-10-CM | POA: Diagnosis not present

## 2018-11-30 DIAGNOSIS — F329 Major depressive disorder, single episode, unspecified: Secondary | ICD-10-CM | POA: Diagnosis not present

## 2018-11-30 DIAGNOSIS — T8149XA Infection following a procedure, other surgical site, initial encounter: Secondary | ICD-10-CM | POA: Diagnosis not present

## 2018-12-08 DIAGNOSIS — F329 Major depressive disorder, single episode, unspecified: Secondary | ICD-10-CM | POA: Diagnosis not present

## 2018-12-10 DIAGNOSIS — Z20828 Contact with and (suspected) exposure to other viral communicable diseases: Secondary | ICD-10-CM | POA: Diagnosis not present

## 2018-12-10 DIAGNOSIS — Z01812 Encounter for preprocedural laboratory examination: Secondary | ICD-10-CM | POA: Diagnosis not present

## 2018-12-10 DIAGNOSIS — F332 Major depressive disorder, recurrent severe without psychotic features: Secondary | ICD-10-CM | POA: Diagnosis not present

## 2018-12-13 DIAGNOSIS — F333 Major depressive disorder, recurrent, severe with psychotic symptoms: Secondary | ICD-10-CM | POA: Diagnosis not present

## 2018-12-13 DIAGNOSIS — F332 Major depressive disorder, recurrent severe without psychotic features: Secondary | ICD-10-CM | POA: Diagnosis not present

## 2018-12-14 DIAGNOSIS — F329 Major depressive disorder, single episode, unspecified: Secondary | ICD-10-CM | POA: Diagnosis not present

## 2018-12-17 DIAGNOSIS — F329 Major depressive disorder, single episode, unspecified: Secondary | ICD-10-CM | POA: Diagnosis not present

## 2018-12-21 DIAGNOSIS — F329 Major depressive disorder, single episode, unspecified: Secondary | ICD-10-CM | POA: Diagnosis not present

## 2018-12-21 DIAGNOSIS — L98492 Non-pressure chronic ulcer of skin of other sites with fat layer exposed: Secondary | ICD-10-CM | POA: Diagnosis not present

## 2018-12-21 DIAGNOSIS — L98429 Non-pressure chronic ulcer of back with unspecified severity: Secondary | ICD-10-CM | POA: Diagnosis not present

## 2018-12-24 DIAGNOSIS — L98492 Non-pressure chronic ulcer of skin of other sites with fat layer exposed: Secondary | ICD-10-CM | POA: Diagnosis not present

## 2018-12-28 DIAGNOSIS — F329 Major depressive disorder, single episode, unspecified: Secondary | ICD-10-CM | POA: Diagnosis not present

## 2018-12-28 DIAGNOSIS — S36898D Other injury of other intra-abdominal organs, subsequent encounter: Secondary | ICD-10-CM | POA: Diagnosis not present

## 2018-12-29 DIAGNOSIS — F329 Major depressive disorder, single episode, unspecified: Secondary | ICD-10-CM | POA: Diagnosis not present

## 2019-01-06 DIAGNOSIS — L98492 Non-pressure chronic ulcer of skin of other sites with fat layer exposed: Secondary | ICD-10-CM | POA: Diagnosis not present

## 2019-01-06 DIAGNOSIS — L98422 Non-pressure chronic ulcer of back with fat layer exposed: Secondary | ICD-10-CM | POA: Diagnosis not present

## 2019-01-11 DIAGNOSIS — F329 Major depressive disorder, single episode, unspecified: Secondary | ICD-10-CM | POA: Diagnosis not present

## 2019-01-19 DIAGNOSIS — F329 Major depressive disorder, single episode, unspecified: Secondary | ICD-10-CM | POA: Diagnosis not present

## 2019-01-21 DIAGNOSIS — F329 Major depressive disorder, single episode, unspecified: Secondary | ICD-10-CM | POA: Diagnosis not present

## 2019-01-27 DIAGNOSIS — L98493 Non-pressure chronic ulcer of skin of other sites with necrosis of muscle: Secondary | ICD-10-CM | POA: Diagnosis not present

## 2019-01-27 DIAGNOSIS — W3400XA Accidental discharge from unspecified firearms or gun, initial encounter: Secondary | ICD-10-CM | POA: Diagnosis not present

## 2019-01-27 DIAGNOSIS — L98492 Non-pressure chronic ulcer of skin of other sites with fat layer exposed: Secondary | ICD-10-CM | POA: Diagnosis not present

## 2019-01-27 DIAGNOSIS — L98429 Non-pressure chronic ulcer of back with unspecified severity: Secondary | ICD-10-CM | POA: Diagnosis not present

## 2019-01-28 DIAGNOSIS — F329 Major depressive disorder, single episode, unspecified: Secondary | ICD-10-CM | POA: Diagnosis not present

## 2019-01-31 ENCOUNTER — Other Ambulatory Visit: Payer: Self-pay

## 2019-01-31 ENCOUNTER — Encounter (HOSPITAL_COMMUNITY): Payer: Self-pay

## 2019-01-31 ENCOUNTER — Ambulatory Visit (HOSPITAL_COMMUNITY)
Admission: EM | Admit: 2019-01-31 | Discharge: 2019-01-31 | Disposition: A | Discharge status: Home / Self Care | Payer: BC Managed Care – PPO

## 2019-01-31 DIAGNOSIS — J320 Chronic maxillary sinusitis: Secondary | ICD-10-CM

## 2019-01-31 DIAGNOSIS — J328 Other chronic sinusitis: Secondary | ICD-10-CM

## 2019-01-31 MED ORDER — IPRATROPIUM BROMIDE 0.06 % NA SOLN: 2.0000 | Freq: Three times a day (TID) | NASAL | 0 refills | Status: DC

## 2019-01-31 MED ORDER — AMOXICILLIN-POT CLAVULANATE 875-125 MG PO TABS: 1.0000 | ORAL_TABLET | Freq: Two times a day (BID) | ORAL | 0 refills | Status: AC

## 2019-01-31 NOTE — Discharge Instructions (Addendum)
Take the augmentin for 10 days  Continue the nasal treatments you have previously started.  Add in nasal saline spray

## 2019-01-31 NOTE — ED Triage Notes (Addendum)
Pt. States he has issues with his sinuses for about 6 days now. States he has nasal congestion and cough, he has a hx of sinus infections. States he has an upcoming surgery & does NOT want COVID testing cause he will get it this week again.

## 2019-01-31 NOTE — ED Provider Notes (Signed)
[facility]    CSN: [id number] Arrival date & time: 01/31/19  1518      History   Chief Complaint Chief Complaint  Patient presents with  . Sinus Problem    HPI Charles Costa is a 54 y.o. male.   Patient with history of chronic sinusitis reports to urgent care today for 1 week history of left sided maxillary facial and left ear pressure and pain. He notes nasal drainage and post nasal drip. Drainage has been yellow at times. He denies a fever or chills. He was slated for sinus surgery in June 2020, however due to covid he chose to hold off. He is currently not wishing to be tested for COVID. He states this is typically how his sinus infections progress, and that they have never resolved on their own.  He has utilized his ipratroprium nasal spray that he was placed on previously and fluticasone nasal spray as prescribed with no relief. He has tried over the counter medications as well.   He denies nausea, vomiting, cough, shortness of breath.      Past Medical History:  Diagnosis Date  . Anxiety   . Depression   . GERD (gastroesophageal reflux disease)   . Hiatal hernia   . Insomnia   . Sleep apnea     Patient Active Problem List   Diagnosis Date Noted  . Other chronic sinusitis 01/12/2018  . Benign hypertension 12/09/2017  . Hyperlipidemia 03/30/2017  . Essential hypertension 03/18/2017  . Depression 03/18/2017  . Obstructive sleep apnea 01/17/2014    Past Surgical History:  Procedure Laterality Date  . hydrocele    . KNEE ARTHROSCOPY         Home Medications    Prior to Admission medications   Medication Sig Start Date End Date Taking? Authorizing Provider  benazepril (LOTENSIN) 40 MG tablet **HOLD UNTIL FOLLOW UP WITH DOCTOR** Previous Dose: 40mg  daily 01/08/18  Yes [provider]  chlordiazePOXIDE (LIBRIUM) 25 MG capsule Take by mouth. 11/05/18  Yes [provider]  clonazePAM Bobbye Charleston) 2 MG tablet Take by mouth. 11/17/17   Yes [provider]  sertraline (ZOLOFT) 50 MG tablet Take by mouth. 12/22/17  Yes [provider]  acetaminophen (TYLENOL) 500 MG tablet Take 1 to 2  tablet(s) by mouth only as needed for pain & fever  *OCCASIONAL use only    [provider]  amoxicillin-clavulanate (AUGMENTIN) 875-125 MG tablet Take 1 tablet by mouth 2 (two) times daily for 10 days. 01/31/19 02/10/19  Chakara Bognar, Marguerita Beards, PA-C  atorvastatin (LIPITOR) 40 MG tablet Take 1 tablet (40 mg total) by mouth daily. 03/30/17   Diallo, Earna Coder, MD  benazepril (LOTENSIN) 40 MG tablet Take 1 tablet (40 mg total) by mouth daily. 06/28/18 10/26/18  Diallo, Earna Coder, MD  chlordiazePOXIDE (LIBRIUM) 25 MG capsule Take 25 mg by mouth 4 (four) times daily. 01/05/19   [provider]  ciprofloxacin (CIPRO) 750 MG tablet Take 750 mg by mouth 2 (two) times daily. 11/01/18   [provider]  CLONAZEPAM PO Take 4 mg by mouth at bedtime as needed.    [provider]  DOK 100 MG capsule  10/09/18   [provider]  doxycycline (VIBRAMYCIN) 100 MG capsule Take 100 mg by mouth 2 (two) times daily. 10/21/18   [provider]  gabapentin (NEURONTIN) 300 MG capsule Take 300 mg by mouth 3 (three) times daily. 09/10/18   [provider]  HYDROcodone-acetaminophen (NORCO/VICODIN) 5-325 MG tablet Take 1  tablet by mouth every 6 (six) hours as needed. 09/10/18   [provider]  ipratropium (ATROVENT) 0.06 % nasal spray Place 2 sprays into both nostrils 3 (three) times daily. 01/31/19   Dana Debo, Marguerita Beards, PA-C  Iron-Vitamins (GERITOL) LIQD Take by mouth.    [provider]  linezolid (ZYVOX) 600 MG tablet Take 600 mg by mouth 2 (two) times daily. 10/09/18   [provider]  methylphenidate (RITALIN) 10 MG tablet TK 1 T PO BID 11/26/17   [provider]  metroNIDAZOLE (FLAGYL) 500 MG tablet Take 500 mg by mouth 3 (three) times daily. 11/01/18   [provider]   omeprazole (PRILOSEC) 20 MG capsule Take 20 mg by mouth as needed.    [provider]  omeprazole (PRILOSEC) 20 MG capsule Take by mouth.    [provider]  oxyCODONE-acetaminophen (PERCOCET/ROXICET) 5-325 MG tablet Take 1 tablet by mouth 2 (two) times daily as needed. 08/28/18   [provider]  polyethylene glycol powder (GLYCOLAX/MIRALAX) 17 GM/SCOOP powder  08/28/18   [provider]  propranolol ER (INDERAL LA) 80 MG 24 hr capsule Take 80 mg by mouth every morning. 09/03/18   [provider]  SENNA PLUS 8.6-50 MG tablet  08/28/18   [provider]  sertraline (ZOLOFT) 25 MG tablet Take 25 mg by mouth daily. 01/21/19   [provider]  sertraline (ZOLOFT) 50 MG tablet Take 50 mg by mouth daily.    [provider]  testosterone enanthate (DELATESTRYL) 200 MG/ML injection Inject into the muscle every 14 (fourteen) days. For IM use only    [provider]  topiramate (TOPAMAX) 25 MG tablet TK 1 T PO BID FOR 7 DAYS THEN TK 2 TS PO BID FOR 7 DAYS 11/03/18   [provider]  traMADol Veatrice Bourbon) 50 MG tablet  09/06/18   [provider]  traZODone (DESYREL) 50 MG tablet Take 50-100 mg by mouth at bedtime as needed. 10/11/18   [provider]  TRIAMCINOLONE ACETONIDE NA Place into the nose.    [provider]  vitamin B-12 (CYANOCOBALAMIN) 500 MCG tablet  10/09/18   [provider]  zaleplon (SONATA) 10 MG capsule Take 10-20 mg by mouth at bedtime as needed. 11/03/18   [provider]  zaleplon (SONATA) 5 MG capsule Take 5-10 mg by mouth at bedtime as needed. 10/28/18   [provider]    Family History Family History  Problem Relation Age of Onset  . Obesity Mother   . Heart failure Father   . Heart disease Father     Social History Social History   Tobacco Use  . Smoking status: Former Smoker    Types: Cigars  . Smokeless tobacco: Never Used  Substance  Use Topics  . Alcohol use: No  . Drug use: No     Allergies   Patient has no known allergies.   Review of Systems Review of Systems  Constitutional: Negative for chills and fever.  HENT: Positive for congestion, ear pain, postnasal drip, sinus pressure, sinus pain and sore throat.   Eyes: Negative for visual disturbance.  Respiratory: Negative for cough, chest tightness and shortness of breath.   Cardiovascular: Negative for chest pain and palpitations.  Gastrointestinal: Negative for abdominal pain, diarrhea, nausea and vomiting.  Musculoskeletal: Negative for arthralgias, back pain and myalgias.  Skin: Negative for color change and rash.  Neurological: Negative for headaches.     Physical Exam Triage Vital Signs ED Triage  Vitals  Enc Vitals Group     BP      Pulse      Resp      Temp      Temp src      SpO2      Weight      Height      Head Circumference      Peak Flow      Pain Score      Pain Loc      Pain Edu?      Excl. in [place]?    No data found.  Updated Vital Signs BP 124/79 (BP Location: Right Arm)   Pulse 95   Temp 98.6 F (37 C) (Oral)   Resp (!) 21   Wt 234 lb 12.8 oz (106.5 kg)   SpO2 97%   BMI 36.77 kg/m   Visual Acuity Right Eye Distance:   Left Eye Distance:   Bilateral Distance:    Right Eye Near:   Left Eye Near:    Bilateral Near:     Physical Exam Vitals and nursing note reviewed.  Constitutional:      Appearance: He is well-developed.  HENT:     Head: Normocephalic and atraumatic.     Right Ear: Tympanic membrane, ear canal and external ear normal.     Left Ear: Ear canal and external ear normal.     Ears:     Comments: Mild effusion behind left TM    Nose: Congestion present.     Comments: Turbinates erythematous with purulent discharge noted on left    Mouth/Throat:     Mouth: Mucous membranes are moist.     Comments: Post nasal drip visible Eyes:     General: No scleral icterus.    Conjunctiva/sclera: Conjunctivae  normal.  Cardiovascular:     Rate and Rhythm: Normal rate and regular rhythm.     Heart sounds: No murmur.  Pulmonary:     Effort: Pulmonary effort is normal. No respiratory distress.     Breath sounds: Normal breath sounds.  Abdominal:     Palpations: Abdomen is soft.     Tenderness: There is no abdominal tenderness.  Musculoskeletal:     Cervical back: Neck supple.     Right lower leg: No edema.     Left lower leg: No edema.  Skin:    General: Skin is warm and dry.  Neurological:     General: No focal deficit present.     Mental Status: He is alert and oriented to person, place, and time.  Psychiatric:        Mood and Affect: Mood normal.        Behavior: Behavior normal.        Thought Content: Thought content normal.        Judgment: Judgment normal.      UC Treatments / Results  Labs (all labs ordered are listed, but only abnormal results are displayed) Labs Reviewed - No data to display  EKG   Radiology No results found.  Procedures Procedures (including critical care time)  Medications Ordered in UC Medications - No data to display  Initial Impression / Assessment and Plan / UC Course  I have reviewed the triage vital signs and the nursing notes.  Pertinent labs & imaging results that were available during my care of the patient were reviewed by me and considered in my medical decision making (see chart for details).    #Chronic Sinusitis - Has  history and upon chart review was scheduled for surgery in 2020. Given history and current symptoms, feel treatment is warranted. Augmentin sent. recommended adding nasal saline regiment. Instructed to re-engage with his ENT.    Final Clinical Impressions(s) / UC Diagnoses   Final diagnoses:  Chronic maxillary sinusitis     Discharge Instructions     Take the augmentin for 10 days  Continue the nasal treatments you have previously started.  Add in nasal saline spray      ED Prescriptions     Medication Sig Dispense Auth. Provider   amoxicillin-clavulanate (AUGMENTIN) 875-125 MG tablet Take 1 tablet by mouth 2 (two) times daily for 10 days. 20 tablet Markiah Janeway, Marguerita Beards, PA-C   ipratropium (ATROVENT) 0.06 % nasal spray Place 2 sprays into both nostrils 3 (three) times daily. 15 mL Dannie Woolen, Marguerita Beards, PA-C     PDMP not reviewed this encounter.   Purnell Shoemaker, PA-C 01/31/19 1718

## 2019-02-01 DIAGNOSIS — F332 Major depressive disorder, recurrent severe without psychotic features: Secondary | ICD-10-CM | POA: Insufficient documentation

## 2019-02-02 DIAGNOSIS — F329 Major depressive disorder, single episode, unspecified: Secondary | ICD-10-CM | POA: Diagnosis not present

## 2019-02-07 MED FILL — BENAZEPRIL HCL 40 MG TABLET: 40 | 90 days supply | Qty: 90 | Fill #2

## 2019-02-08 DIAGNOSIS — F329 Major depressive disorder, single episode, unspecified: Secondary | ICD-10-CM | POA: Diagnosis not present

## 2019-02-09 DIAGNOSIS — Z20822 Contact with and (suspected) exposure to covid-19: Secondary | ICD-10-CM | POA: Diagnosis not present

## 2019-02-09 DIAGNOSIS — Z01812 Encounter for preprocedural laboratory examination: Secondary | ICD-10-CM | POA: Diagnosis not present

## 2019-02-09 DIAGNOSIS — F332 Major depressive disorder, recurrent severe without psychotic features: Secondary | ICD-10-CM | POA: Diagnosis not present

## 2019-02-11 DIAGNOSIS — F332 Major depressive disorder, recurrent severe without psychotic features: Secondary | ICD-10-CM | POA: Diagnosis not present

## 2019-02-13 DIAGNOSIS — Z01812 Encounter for preprocedural laboratory examination: Secondary | ICD-10-CM | POA: Diagnosis not present

## 2019-02-13 DIAGNOSIS — F332 Major depressive disorder, recurrent severe without psychotic features: Secondary | ICD-10-CM | POA: Diagnosis not present

## 2019-02-13 DIAGNOSIS — Z20822 Contact with and (suspected) exposure to covid-19: Secondary | ICD-10-CM | POA: Diagnosis not present

## 2019-02-14 DIAGNOSIS — F332 Major depressive disorder, recurrent severe without psychotic features: Secondary | ICD-10-CM | POA: Diagnosis not present

## 2019-02-16 DIAGNOSIS — F329 Major depressive disorder, single episode, unspecified: Secondary | ICD-10-CM | POA: Diagnosis not present

## 2019-02-18 DIAGNOSIS — F329 Major depressive disorder, single episode, unspecified: Secondary | ICD-10-CM | POA: Diagnosis not present

## 2019-02-23 DIAGNOSIS — F329 Major depressive disorder, single episode, unspecified: Secondary | ICD-10-CM | POA: Diagnosis not present

## 2019-03-01 DIAGNOSIS — F329 Major depressive disorder, single episode, unspecified: Secondary | ICD-10-CM | POA: Diagnosis not present

## 2019-03-08 DIAGNOSIS — F329 Major depressive disorder, single episode, unspecified: Secondary | ICD-10-CM | POA: Diagnosis not present

## 2019-03-10 DIAGNOSIS — G4733 Obstructive sleep apnea (adult) (pediatric): Secondary | ICD-10-CM | POA: Diagnosis not present

## 2019-03-15 ENCOUNTER — Other Ambulatory Visit (HOSPITAL_COMMUNITY): Payer: Self-pay | Admitting: Student

## 2019-03-15 DIAGNOSIS — D649 Anemia, unspecified: Secondary | ICD-10-CM | POA: Diagnosis not present

## 2019-03-15 DIAGNOSIS — F329 Major depressive disorder, single episode, unspecified: Secondary | ICD-10-CM | POA: Diagnosis not present

## 2019-03-15 DIAGNOSIS — I10 Essential (primary) hypertension: Secondary | ICD-10-CM | POA: Diagnosis not present

## 2019-03-15 DIAGNOSIS — E538 Deficiency of other specified B group vitamins: Secondary | ICD-10-CM | POA: Diagnosis not present

## 2019-03-15 DIAGNOSIS — E782 Mixed hyperlipidemia: Secondary | ICD-10-CM | POA: Diagnosis not present

## 2019-03-18 DIAGNOSIS — G4733 Obstructive sleep apnea (adult) (pediatric): Secondary | ICD-10-CM | POA: Diagnosis not present

## 2019-03-19 MED FILL — ATORVASTATIN 10 MG TABLET: 10 | 90 days supply | Qty: 90 | Fill #0

## 2019-03-23 DIAGNOSIS — F329 Major depressive disorder, single episode, unspecified: Secondary | ICD-10-CM | POA: Diagnosis not present

## 2019-03-30 DIAGNOSIS — F329 Major depressive disorder, single episode, unspecified: Secondary | ICD-10-CM | POA: Diagnosis not present

## 2019-03-30 DIAGNOSIS — J310 Chronic rhinitis: Secondary | ICD-10-CM | POA: Diagnosis not present

## 2019-03-30 DIAGNOSIS — Z6836 Body mass index (BMI) 36.0-36.9, adult: Secondary | ICD-10-CM | POA: Diagnosis not present

## 2019-04-05 DIAGNOSIS — F329 Major depressive disorder, single episode, unspecified: Secondary | ICD-10-CM | POA: Diagnosis not present

## 2019-04-13 DIAGNOSIS — F329 Major depressive disorder, single episode, unspecified: Secondary | ICD-10-CM | POA: Diagnosis not present

## 2019-04-20 DIAGNOSIS — F329 Major depressive disorder, single episode, unspecified: Secondary | ICD-10-CM | POA: Diagnosis not present

## 2019-04-22 DIAGNOSIS — R0981 Nasal congestion: Secondary | ICD-10-CM | POA: Insufficient documentation

## 2019-04-25 MED FILL — BENAZEPRIL HCL 40 MG TABLET: 40 | 90 days supply | Qty: 90 | Fill #0

## 2019-04-27 DIAGNOSIS — F329 Major depressive disorder, single episode, unspecified: Secondary | ICD-10-CM | POA: Diagnosis not present

## 2019-05-04 DIAGNOSIS — F329 Major depressive disorder, single episode, unspecified: Secondary | ICD-10-CM | POA: Diagnosis not present

## 2019-05-11 DIAGNOSIS — F329 Major depressive disorder, single episode, unspecified: Secondary | ICD-10-CM | POA: Diagnosis not present

## 2019-05-18 DIAGNOSIS — F329 Major depressive disorder, single episode, unspecified: Secondary | ICD-10-CM | POA: Diagnosis not present

## 2019-05-25 DIAGNOSIS — F329 Major depressive disorder, single episode, unspecified: Secondary | ICD-10-CM | POA: Diagnosis not present

## 2019-05-31 DIAGNOSIS — F332 Major depressive disorder, recurrent severe without psychotic features: Secondary | ICD-10-CM | POA: Diagnosis not present

## 2019-06-01 ENCOUNTER — Ambulatory Visit: Payer: BC Managed Care – PPO | Admitting: Family Medicine

## 2019-06-03 DIAGNOSIS — F329 Major depressive disorder, single episode, unspecified: Secondary | ICD-10-CM | POA: Diagnosis not present

## 2019-06-08 DIAGNOSIS — F329 Major depressive disorder, single episode, unspecified: Secondary | ICD-10-CM | POA: Diagnosis not present

## 2019-06-15 DIAGNOSIS — F329 Major depressive disorder, single episode, unspecified: Secondary | ICD-10-CM | POA: Diagnosis not present

## 2019-06-19 ENCOUNTER — Other Ambulatory Visit: Payer: Self-pay

## 2019-06-19 ENCOUNTER — Ambulatory Visit (HOSPITAL_COMMUNITY)
Admission: EM | Admit: 2019-06-19 | Discharge: 2019-06-19 | Disposition: A | Discharge status: Home / Self Care | Payer: BC Managed Care – PPO | Attending: Family Medicine | Admitting: Family Medicine

## 2019-06-19 ENCOUNTER — Encounter (HOSPITAL_COMMUNITY): Payer: Self-pay

## 2019-06-19 DIAGNOSIS — J320 Chronic maxillary sinusitis: Secondary | ICD-10-CM | POA: Diagnosis not present

## 2019-06-19 MED ORDER — AMOXICILLIN-POT CLAVULANATE 875-125 MG PO TABS: 1.0000 | ORAL_TABLET | Freq: Two times a day (BID) | ORAL | 0 refills | Status: DC

## 2019-06-19 MED ORDER — CETIRIZINE HCL 10 MG PO TABS: 10.0000 mg | ORAL_TABLET | Freq: Every day | ORAL | 0 refills | Status: DC

## 2019-06-19 NOTE — Discharge Instructions (Addendum)
Treating you for sinus infection.  Take medication as prescribed. Follow up as needed for continued or worsening symptoms

## 2019-06-19 NOTE — ED Triage Notes (Addendum)
Pt c/o pain to left side of face and sore throat, "I have a sinus infection" Pt declined COVID testing.

## 2019-06-20 ENCOUNTER — Telehealth: Payer: Self-pay | Admitting: Family Medicine
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
## 2019-06-21 ENCOUNTER — Ambulatory Visit (INDEPENDENT_AMBULATORY_CARE_PROVIDER_SITE_OTHER): Payer: BC Managed Care – PPO | Admitting: Family Medicine

## 2019-06-21 ENCOUNTER — Encounter: Payer: Self-pay | Admitting: Family Medicine

## 2019-06-21 ENCOUNTER — Other Ambulatory Visit: Payer: Self-pay

## 2019-06-21 DIAGNOSIS — F339 Major depressive disorder, recurrent, unspecified: Secondary | ICD-10-CM

## 2019-06-21 DIAGNOSIS — I10 Essential (primary) hypertension: Secondary | ICD-10-CM | POA: Diagnosis not present

## 2019-06-21 DIAGNOSIS — J0101 Acute recurrent maxillary sinusitis: Secondary | ICD-10-CM

## 2019-06-21 DIAGNOSIS — J329 Chronic sinusitis, unspecified: Secondary | ICD-10-CM | POA: Insufficient documentation

## 2019-06-21 MED ORDER — OLOPATADINE HCL 0.6 % NA SOLN: 1.0000 | Freq: Two times a day (BID) | NASAL | 0 refills | Status: DC | PRN

## 2019-06-21 MED ORDER — AMOXICILLIN-POT CLAVULANATE 875-125 MG PO TABS: 1.0000 | ORAL_TABLET | Freq: Two times a day (BID) | ORAL | 0 refills | Status: AC

## 2019-06-21 NOTE — Assessment & Plan Note (Signed)
He reports he has no plan to harm himself he also notes that he is not interested in discussing his depression with me today.  He notes that he already has a provider for this issue and is already provided medication by different provider.  He is not interested in any additional help or a suicide prevention hotline for this issue.

## 2019-06-21 NOTE — Assessment & Plan Note (Signed)
Stable today.  He declined labs to check renal function.  He plans to return to [facility] where he is monitored with routine labs.

## 2019-06-21 NOTE — Patient Instructions (Signed)
Sinusitis: Based on your symptoms, this seems like this is most likely a viral infection.  I understand you have a significant history of multiple sinus infections.  I provided an additional 3 days of antibiotics to the antibiotics you have already received from urgent care.  I have also refilled your nasal spray.  I would be happy to continue seeing you in clinic if your arrangement in [facility] does not work out.

## 2019-06-21 NOTE — Assessment & Plan Note (Addendum)
Most likely viral based on symptoms and time course.  He was informed that he would not likely benefit from additional antibiotics at this time.  At this point, he got up and attempted to leave the room because he would not be given additional antibiotics.  We reached an agreement where I would provide an additional 3 days of antibiotics in order to complete the visit. -3 pills of Augmentin prescribed -Nasal spray

## 2019-06-21 NOTE — ED Provider Notes (Signed)
[place]    CSN: [id number] Arrival date & time: 06/19/19  1501      History   Chief Complaint Chief Complaint  Patient presents with  . Facial Pain    HPI Charles Costa is a 54 y.o. male.   Patient is a 54 year old male with recurrent sinusitis the presents today with left maxillary facial tenderness, sore throat, postnasal drip.  Symptoms consistent with previous sinus infections. Symptoms have been present for 1 week.  Patient is scheduled for septoplasty next month.  Has been using Nasacort.  No fever, chills, cough, chest congestion, shortness of breath.   ROS per HPI      Past Medical History:  Diagnosis Date  . Anxiety   . Depression   . GERD (gastroesophageal reflux disease)   . Hiatal hernia   . Insomnia   . Sleep apnea     Patient Active Problem List   Diagnosis Date Noted  . Other chronic sinusitis 01/12/2018  . Benign hypertension 12/09/2017  . Hyperlipidemia 03/30/2017  . Essential hypertension 03/18/2017  . Depression 03/18/2017  . Obstructive sleep apnea 01/17/2014    Past Surgical History:  Procedure Laterality Date  . hydrocele    . KNEE ARTHROSCOPY         Home Medications    Prior to Admission medications   Medication Sig Start Date End Date Taking? Authorizing Provider  acetaminophen (TYLENOL) 500 MG tablet Take 1 to 2  tablet(s) by mouth only as needed for pain & fever  *OCCASIONAL use only    [provider]  amoxicillin-clavulanate (AUGMENTIN) 875-125 MG tablet Take 1 tablet by mouth 2 (two) times daily for 7 days. 06/19/19 06/26/19  Loura Halt A, NP  benazepril (LOTENSIN) 40 MG tablet Take 1 tablet (40 mg total) by mouth daily. 06/28/18 10/26/18  Diallo, Earna Coder, MD  benazepril (LOTENSIN) 40 MG tablet **HOLD UNTIL FOLLOW UP WITH DOCTOR** Previous Dose: 40mg  daily 01/08/18   [provider]  cetirizine (ZYRTEC) 10 MG tablet Take 1 tablet (10 mg total) by mouth daily. 06/19/19   Loura Halt A, NP   chlordiazePOXIDE (LIBRIUM) 25 MG capsule Take by mouth. 11/05/18   [provider]  chlordiazePOXIDE (LIBRIUM) 25 MG capsule Take 25 mg by mouth 4 (four) times daily. 01/05/19   [provider]  ipratropium (ATROVENT) 0.06 % nasal spray Place 2 sprays into both nostrils 3 (three) times daily. 01/31/19   Darr, Marguerita Beards, PA-C  Iron-Vitamins (GERITOL) LIQD Take by mouth.    [provider]  omeprazole (PRILOSEC) 20 MG capsule Take 20 mg by mouth as needed.    [provider]  omeprazole (PRILOSEC) 20 MG capsule Take by mouth.    [provider]  propranolol ER (INDERAL LA) 80 MG 24 hr capsule Take 80 mg by mouth every morning. 09/03/18   [provider]  SENNA PLUS 8.6-50 MG tablet  08/28/18   [provider]  sertraline (ZOLOFT) 25 MG tablet Take 25 mg by mouth daily. 01/21/19   [provider]  sertraline (ZOLOFT) 50 MG tablet Take 50 mg by mouth daily.    [provider]  sertraline (ZOLOFT) 50 MG tablet Take by mouth. 12/22/17   [provider]    Family History Family History  Problem Relation Age of Onset  . Obesity Mother   . Heart failure Father   . Heart disease Father     Social History Social History   Tobacco Use  . Smoking status:  Former Smoker    Types: Cigars  . Smokeless tobacco: Never Used  Substance Use Topics  . Alcohol use: No  . Drug use: No     Allergies   Patient has no known allergies.   Review of Systems Review of Systems   Physical Exam Triage Vital Signs ED Triage Vitals  Enc Vitals Group     BP 06/19/19 1532 138/76     Pulse Rate 06/19/19 1531 65     Resp 06/19/19 1531 16     Temp 06/19/19 1531 98.9 F (37.2 C)     Temp src --      SpO2 06/19/19 1531 100 %     Weight --      Height --      Head Circumference --      Peak Flow --      Pain Score 06/19/19 1531 6     Pain Loc --      Pain Edu? --      Excl. in [place]? --    No data found.  Updated  Vital Signs BP 138/76   Pulse 65   Temp 98.9 F (37.2 C)   Resp 16   SpO2 100%   Visual Acuity Right Eye Distance:   Left Eye Distance:   Bilateral Distance:    Right Eye Near:   Left Eye Near:    Bilateral Near:     Physical Exam Vitals and nursing note reviewed.  Constitutional:      General: He is not in acute distress.    Appearance: Normal appearance. He is not ill-appearing, toxic-appearing or diaphoretic.  HENT:     Head: Normocephalic and atraumatic.     Nose:     Left Sinus: Maxillary sinus tenderness present.     Mouth/Throat:     Pharynx: Uvula midline. No posterior oropharyngeal erythema.     Comments: PND  Eyes:     Conjunctiva/sclera: Conjunctivae normal.  Pulmonary:     Effort: Pulmonary effort is normal.  Musculoskeletal:        General: Normal range of motion.     Cervical back: Normal range of motion.  Skin:    General: Skin is warm and dry.  Neurological:     Mental Status: He is alert.  Psychiatric:        Mood and Affect: Mood normal.      UC Treatments / Results  Labs (all labs ordered are listed, but only abnormal results are displayed) Labs Reviewed - No data to display  EKG   Radiology No results found.  Procedures Procedures (including critical care time)  Medications Ordered in UC Medications - No data to display  Initial Impression / Assessment and Plan / UC Course  I have reviewed the triage vital signs and the nursing notes.  Pertinent labs & imaging results that were available during my care of the patient were reviewed by me and considered in my medical decision making (see chart for details).     Chronic maxillary sinusitis Treating with Augmentin.  Recommended Zyrtec daily and continue the Nasacort Follow-up with specialist as scheduled  Final Clinical Impressions(s) / UC Diagnoses   Final diagnoses:  Chronic maxillary sinusitis     Discharge Instructions     Treating you for sinus infection.  Take  medication as prescribed. Follow up as needed for continued or worsening symptoms     ED Prescriptions    Medication Sig Dispense Auth. Provider   amoxicillin-clavulanate (AUGMENTIN) 875-125  MG tablet Take 1 tablet by mouth 2 (two) times daily for 7 days. 14 tablet Priscilla Kirstein A, NP   cetirizine (ZYRTEC) 10 MG tablet Take 1 tablet (10 mg total) by mouth daily. 30 tablet Loura Halt A, NP     PDMP not reviewed this encounter.   Orvan July, NP 06/21/19 [phone]

## 2019-06-22 DIAGNOSIS — F329 Major depressive disorder, single episode, unspecified: Secondary | ICD-10-CM | POA: Diagnosis not present

## 2019-06-24 DIAGNOSIS — J310 Chronic rhinitis: Secondary | ICD-10-CM | POA: Diagnosis not present

## 2019-06-24 DIAGNOSIS — J019 Acute sinusitis, unspecified: Secondary | ICD-10-CM | POA: Diagnosis not present

## 2019-06-29 DIAGNOSIS — F329 Major depressive disorder, single episode, unspecified: Secondary | ICD-10-CM | POA: Diagnosis not present

## 2019-07-06 DIAGNOSIS — F329 Major depressive disorder, single episode, unspecified: Secondary | ICD-10-CM | POA: Diagnosis not present

## 2019-07-12 DIAGNOSIS — Z20822 Contact with and (suspected) exposure to covid-19: Secondary | ICD-10-CM | POA: Diagnosis not present

## 2019-07-12 DIAGNOSIS — Z01812 Encounter for preprocedural laboratory examination: Secondary | ICD-10-CM | POA: Diagnosis not present

## 2019-07-13 DIAGNOSIS — F329 Major depressive disorder, single episode, unspecified: Secondary | ICD-10-CM | POA: Diagnosis not present

## 2019-07-14 DIAGNOSIS — J3489 Other specified disorders of nose and nasal sinuses: Secondary | ICD-10-CM | POA: Diagnosis not present

## 2019-07-14 DIAGNOSIS — J343 Hypertrophy of nasal turbinates: Secondary | ICD-10-CM | POA: Diagnosis not present

## 2019-07-14 DIAGNOSIS — G4733 Obstructive sleep apnea (adult) (pediatric): Secondary | ICD-10-CM | POA: Diagnosis not present

## 2019-07-14 DIAGNOSIS — J329 Chronic sinusitis, unspecified: Secondary | ICD-10-CM | POA: Diagnosis not present

## 2019-07-14 DIAGNOSIS — I10 Essential (primary) hypertension: Secondary | ICD-10-CM | POA: Diagnosis not present

## 2019-07-14 DIAGNOSIS — H9202 Otalgia, left ear: Secondary | ICD-10-CM | POA: Diagnosis not present

## 2019-07-14 DIAGNOSIS — Z79899 Other long term (current) drug therapy: Secondary | ICD-10-CM | POA: Diagnosis not present

## 2019-07-14 DIAGNOSIS — F329 Major depressive disorder, single episode, unspecified: Secondary | ICD-10-CM | POA: Diagnosis not present

## 2019-07-14 DIAGNOSIS — J342 Deviated nasal septum: Secondary | ICD-10-CM | POA: Diagnosis not present

## 2019-07-14 DIAGNOSIS — E669 Obesity, unspecified: Secondary | ICD-10-CM | POA: Diagnosis not present

## 2019-07-14 DIAGNOSIS — K219 Gastro-esophageal reflux disease without esophagitis: Secondary | ICD-10-CM | POA: Diagnosis not present

## 2019-07-14 DIAGNOSIS — F419 Anxiety disorder, unspecified: Secondary | ICD-10-CM | POA: Diagnosis not present

## 2019-07-14 DIAGNOSIS — H9190 Unspecified hearing loss, unspecified ear: Secondary | ICD-10-CM | POA: Diagnosis not present

## 2019-07-14 DIAGNOSIS — F17220 Nicotine dependence, chewing tobacco, uncomplicated: Secondary | ICD-10-CM | POA: Diagnosis not present

## 2019-07-14 DIAGNOSIS — R0981 Nasal congestion: Secondary | ICD-10-CM | POA: Diagnosis not present

## 2019-07-20 DIAGNOSIS — F329 Major depressive disorder, single episode, unspecified: Secondary | ICD-10-CM | POA: Diagnosis not present

## 2019-08-01 MED FILL — BENAZEPRIL HCL 40 MG TABLET: 40 | 90 days supply | Qty: 90 | Fill #1

## 2019-08-03 DIAGNOSIS — F329 Major depressive disorder, single episode, unspecified: Secondary | ICD-10-CM | POA: Diagnosis not present

## 2019-08-10 DIAGNOSIS — F329 Major depressive disorder, single episode, unspecified: Secondary | ICD-10-CM | POA: Diagnosis not present

## 2019-08-16 DIAGNOSIS — G4733 Obstructive sleep apnea (adult) (pediatric): Secondary | ICD-10-CM | POA: Diagnosis not present

## 2019-08-17 DIAGNOSIS — F329 Major depressive disorder, single episode, unspecified: Secondary | ICD-10-CM | POA: Diagnosis not present

## 2019-08-19 DIAGNOSIS — J310 Chronic rhinitis: Secondary | ICD-10-CM | POA: Diagnosis not present

## 2019-08-24 DIAGNOSIS — G4733 Obstructive sleep apnea (adult) (pediatric): Secondary | ICD-10-CM | POA: Diagnosis not present

## 2019-08-24 DIAGNOSIS — F329 Major depressive disorder, single episode, unspecified: Secondary | ICD-10-CM | POA: Diagnosis not present

## 2019-08-29 ENCOUNTER — Ambulatory Visit (HOSPITAL_COMMUNITY)
Admission: RE | Admit: 2019-08-29 | Discharge: 2019-08-29 | Disposition: A | Discharge status: Home / Self Care | Payer: BC Managed Care – PPO | Source: Ambulatory Visit | Attending: Family Medicine | Admitting: Family Medicine

## 2019-08-29 ENCOUNTER — Ambulatory Visit (INDEPENDENT_AMBULATORY_CARE_PROVIDER_SITE_OTHER): Payer: BC Managed Care – PPO | Admitting: Family Medicine

## 2019-08-29 ENCOUNTER — Encounter: Payer: Self-pay | Admitting: Family Medicine

## 2019-08-29 ENCOUNTER — Other Ambulatory Visit: Payer: Self-pay

## 2019-08-29 VITALS — BP 120/70 | HR 97 | Ht 67.0 in | Wt 245.2 lb

## 2019-08-29 DIAGNOSIS — R079 Chest pain, unspecified: Secondary | ICD-10-CM | POA: Diagnosis not present

## 2019-08-29 DIAGNOSIS — I10 Essential (primary) hypertension: Secondary | ICD-10-CM

## 2019-08-29 DIAGNOSIS — K219 Gastro-esophageal reflux disease without esophagitis: Secondary | ICD-10-CM | POA: Insufficient documentation

## 2019-08-29 DIAGNOSIS — N2889 Other specified disorders of kidney and ureter: Secondary | ICD-10-CM | POA: Insufficient documentation

## 2019-08-29 DIAGNOSIS — Z9889 Other specified postprocedural states: Secondary | ICD-10-CM

## 2019-08-29 DIAGNOSIS — E278 Other specified disorders of adrenal gland: Secondary | ICD-10-CM | POA: Insufficient documentation

## 2019-08-29 DIAGNOSIS — N281 Cyst of kidney, acquired: Secondary | ICD-10-CM | POA: Insufficient documentation

## 2019-08-29 DIAGNOSIS — Z1159 Encounter for screening for other viral diseases: Secondary | ICD-10-CM

## 2019-08-29 DIAGNOSIS — G4733 Obstructive sleep apnea (adult) (pediatric): Secondary | ICD-10-CM

## 2019-08-29 MED ORDER — FAMOTIDINE 20 MG PO TABS: 20.0000 mg | ORAL_TABLET | Freq: Every day | ORAL | 0 refills | Status: DC

## 2019-08-29 MED FILL — FAMOTIDINE 20 MG TABS: 20 | 60 days supply | Qty: 60 | Fill #0

## 2019-08-29 NOTE — Progress Notes (Signed)
    SUBJECTIVE:   CHIEF COMPLAINT / HPI:   Follow up on hospital CT scanning Mr. Wolters presented to clinic today to follow-up regarding a CT that had been done at [facility] during hospitalization last year.  He was particularly concerned about 2 incidental findings on the abdominal CT.  CT was notable for a 2 cm mass with a cystic appearance on one of his kidneys and a another 2 cm mass on an adrenal gland.  Per the radiology report provided by Mr. Keeble, there is a recommendation to follow-up the renal mass with a renal ultrasound and a recommendation to follow-up the adrenal mass with either a CT or MRI with a adrenal mass protocol.  Obstructive sleep apnea He was initially diagnosed with sleep apnea in 2007.  When he was still living in [place], he would visit regularly with a pulmonologist and sleep medicine specialist.  He would like to be referred again to a sleep medicine specialist who is also a pulmonologist.  Chest discomfort He has noted some discomfort in his chest that has been ongoing for the past several days to a week.  He describes this sensation as a general tightness.  He denies radiation to the left arm, jaw, back.  He does not associate this discomfort with activities.  He is not aware of anything that specifically makes it better or worse.  Reports that he recently went on a long hike along the [place] without significant exacerbation of this chest pain.   PERTINENT  PMH / PSH: Mood disorder, OSA  OBJECTIVE:   BP 120/70   Pulse 97   Ht 5\' 7"  (1.702 m)   Wt 245 lb 4 oz (111.2 kg)   SpO2 95%   BMI 38.41 kg/m    General: Alert and cooperative and appears to be in no acute distress HEENT: Neck non-tender without lymphadenopathy, masses or thyromegaly Cardio: Normal S1 and S2, no S3 or S4. Rhythm is regular. No murmurs or rubs.   Pulm: Clear to auscultation bilaterally, no crackles, wheezing, or diminished breath sounds. Normal respiratory effort Abdomen:  Bowel sounds normal.  Abdomen is soft and mildly tender to palpation with pressure in the epigastric region.  Reproducible chest discomfort with pressure in the epigastric region. Extremities: No peripheral edema. Warm/ well perfused.  Strong radial pulse. Neuro: Cranial nerves grossly intact  EKG: No Q waves, ST changes or T wave inversions.  ASSESSMENT/PLAN:   Adrenal mass ([place]) -CT abdomen ordered and scheduled  Renal mass -Renal ultrasound scheduled  GERD (gastroesophageal reflux disease) Very low suspicion for cardiac etiology of his chest pain.  Physical exam was notable for reproducible discomfort with pressure in the epigastric area. -Continue Prilosec -Add famotidine 20 mg daily.  Increase to 20 mg twice daily if no improvement in 1 week.  Obstructive sleep apnea -Placed referral to sleep specialist     Matilde Haymaker, MD [place]

## 2019-08-29 NOTE — Assessment & Plan Note (Signed)
-  Placed referral to sleep specialist

## 2019-08-29 NOTE — Assessment & Plan Note (Signed)
-  CT abdomen ordered and scheduled

## 2019-08-29 NOTE — Assessment & Plan Note (Signed)
>>  ASSESSMENT AND PLAN FOR ADRENAL MASS ([facility]) WRITTEN ON 08/29/2019  6:41 PM BY Mirian Mo, MD  -CT abdomen ordered and scheduled

## 2019-08-29 NOTE — Assessment & Plan Note (Signed)
" >>  ASSESSMENT AND PLAN FOR RENAL MASS WRITTEN ON 08/29/2019  6:41 PM BY DEMPSEY COY, MD  -Renal ultrasound scheduled "

## 2019-08-29 NOTE — Assessment & Plan Note (Signed)
Very low suspicion for cardiac etiology of his chest pain.  Physical exam was notable for reproducible discomfort with pressure in the epigastric area. -Continue Prilosec -Add famotidine 20 mg daily.  Increase to 20 mg twice daily if no improvement in 1 week.

## 2019-08-29 NOTE — Patient Instructions (Signed)
Renal mass: I have put in an order for renal ultrasound.  We will try to schedule him for a left clinic today.  Adrenal mass: I have also put in an order for a CT abdomen to follow-up on this adrenal mass.  Obstructive sleep apnea: I have put in referral for you to see a sleep medicine pulmonologist for your obstructive sleep apnea.  You should get a call in the next 2 weeks or so to schedule this appointment.  Epigastric pain: I think this is very likely related to your history of GERD.  There may be other contributors as well.  For now, lets start with adding famotidine 20 mg to her daily medications.  Take this once daily for a week.  If it does not help, increase to twice daily for a week.

## 2019-08-29 NOTE — Assessment & Plan Note (Signed)
-  Renal ultrasound scheduled

## 2019-08-30 ENCOUNTER — Telehealth: Payer: Self-pay

## 2019-08-30 ENCOUNTER — Other Ambulatory Visit: Payer: BLUE CROSS/BLUE SHIELD

## 2019-08-30 NOTE — Telephone Encounter (Signed)
Received fax from pharmacy, PA needed on Famotidine 20mg . Clinical questions submitted via Cover My Meds. Waiting on response, could take up to 72 hours.  Cover My Meds info: Key: [id number]

## 2019-08-31 DIAGNOSIS — F329 Major depressive disorder, single episode, unspecified: Secondary | ICD-10-CM | POA: Diagnosis not present

## 2019-09-01 ENCOUNTER — Other Ambulatory Visit: Payer: BLUE CROSS/BLUE SHIELD

## 2019-09-01 ENCOUNTER — Other Ambulatory Visit: Payer: Self-pay | Admitting: Family Medicine

## 2019-09-01 ENCOUNTER — Other Ambulatory Visit: Payer: Self-pay

## 2019-09-01 DIAGNOSIS — Z9049 Acquired absence of other specified parts of digestive tract: Secondary | ICD-10-CM | POA: Diagnosis not present

## 2019-09-01 DIAGNOSIS — Z1159 Encounter for screening for other viral diseases: Secondary | ICD-10-CM | POA: Diagnosis not present

## 2019-09-01 DIAGNOSIS — Z9889 Other specified postprocedural states: Secondary | ICD-10-CM | POA: Diagnosis not present

## 2019-09-01 DIAGNOSIS — I10 Essential (primary) hypertension: Secondary | ICD-10-CM | POA: Diagnosis not present

## 2019-09-02 ENCOUNTER — Other Ambulatory Visit: Payer: Self-pay | Admitting: Family Medicine

## 2019-09-02 ENCOUNTER — Telehealth: Payer: Self-pay | Admitting: Family Medicine

## 2019-09-02 DIAGNOSIS — E278 Other specified disorders of adrenal gland: Secondary | ICD-10-CM

## 2019-09-02 LAB — BASIC METABOLIC PANEL
BUN/Creatinine Ratio: 14 (ref 9–20)
BUN: 11 mg/dL (ref 6–24)
CO2: 22 mmol/L (ref 20–29)
Calcium: 9.4 mg/dL (ref 8.7–10.2)
Chloride: 103 mmol/L (ref 96–106)
Creatinine, Ser: 0.78 mg/dL (ref 0.76–1.27)
GFR calc Af Amer: 120 mL/min/{1.73_m2} (ref >59)
GFR calc non Af Amer: 104 mL/min/{1.73_m2} (ref >59)
Glucose: 110 mg/dL — ABNORMAL HIGH (ref 65–99)
Potassium: 3.9 mmol/L (ref 3.5–5.2)
Sodium: 141 mmol/L (ref 134–144)

## 2019-09-02 LAB — VITAMIN B12: Vitamin B-12: 646 pg/mL (ref 232–1245)

## 2019-09-02 LAB — HEPATITIS C ANTIBODY: Hep C Virus Ab: <0.1 s/co ratio (ref 0.0–0.9)

## 2019-09-02 NOTE — Telephone Encounter (Signed)
Patient calling to let Dr. Pilar Plate know he will be staying with his current doctor for his sleep apnea/pulmonologist - Dr. Heinz Knuckles. He has changed his mind about the new referral.  Routing to PCP and Jazmin - referral coordinator.

## 2019-09-02 NOTE — Telephone Encounter (Signed)
Referral cancelled.  Darriona Dehaas,CMA

## 2019-09-05 DIAGNOSIS — G4733 Obstructive sleep apnea (adult) (pediatric): Secondary | ICD-10-CM | POA: Diagnosis not present

## 2019-09-07 DIAGNOSIS — F329 Major depressive disorder, single episode, unspecified: Secondary | ICD-10-CM | POA: Diagnosis not present

## 2019-09-08 ENCOUNTER — Other Ambulatory Visit: Payer: Medicaid Other

## 2019-09-08 ENCOUNTER — Other Ambulatory Visit: Payer: BLUE CROSS/BLUE SHIELD

## 2019-09-08 ENCOUNTER — Ambulatory Visit
Admission: RE | Admit: 2019-09-08 | Discharge: 2019-09-08 | Disposition: A | Discharge status: Home / Self Care | Payer: Medicaid Other | Source: Ambulatory Visit | Attending: Family Medicine | Admitting: Family Medicine

## 2019-09-08 DIAGNOSIS — I700 Atherosclerosis of aorta: Secondary | ICD-10-CM | POA: Diagnosis not present

## 2019-09-08 DIAGNOSIS — E278 Other specified disorders of adrenal gland: Secondary | ICD-10-CM

## 2019-09-08 DIAGNOSIS — K7689 Other specified diseases of liver: Secondary | ICD-10-CM | POA: Diagnosis not present

## 2019-09-08 DIAGNOSIS — N281 Cyst of kidney, acquired: Secondary | ICD-10-CM | POA: Diagnosis not present

## 2019-09-08 MED ORDER — IOPAMIDOL (ISOVUE-300) INJECTION 61%
100.0000 mL | Freq: Once | INTRAVENOUS | Status: AC | PRN
  Administered 2019-09-08: 100 mL via INTRAVENOUS

## 2019-09-13 ENCOUNTER — Ambulatory Visit
Admission: RE | Admit: 2019-09-13 | Discharge: 2019-09-13 | Disposition: A | Discharge status: Home / Self Care | Payer: Medicaid Other | Source: Ambulatory Visit | Attending: Family Medicine | Admitting: Family Medicine

## 2019-09-13 DIAGNOSIS — N2889 Other specified disorders of kidney and ureter: Secondary | ICD-10-CM | POA: Diagnosis not present

## 2019-09-13 DIAGNOSIS — N281 Cyst of kidney, acquired: Secondary | ICD-10-CM | POA: Diagnosis not present

## 2019-09-13 DIAGNOSIS — E278 Other specified disorders of adrenal gland: Secondary | ICD-10-CM | POA: Diagnosis not present

## 2019-09-14 ENCOUNTER — Encounter: Payer: Self-pay | Admitting: Family Medicine

## 2019-09-14 DIAGNOSIS — D3500 Benign neoplasm of unspecified adrenal gland: Secondary | ICD-10-CM

## 2019-09-21 DIAGNOSIS — F329 Major depressive disorder, single episode, unspecified: Secondary | ICD-10-CM | POA: Diagnosis not present

## 2019-09-27 DIAGNOSIS — F1320 Sedative, hypnotic or anxiolytic dependence, uncomplicated: Secondary | ICD-10-CM | POA: Diagnosis not present

## 2019-09-27 DIAGNOSIS — F609 Personality disorder, unspecified: Secondary | ICD-10-CM | POA: Diagnosis not present

## 2019-09-27 DIAGNOSIS — F332 Major depressive disorder, recurrent severe without psychotic features: Secondary | ICD-10-CM | POA: Diagnosis not present

## 2019-10-05 DIAGNOSIS — F329 Major depressive disorder, single episode, unspecified: Secondary | ICD-10-CM | POA: Diagnosis not present

## 2019-10-19 DIAGNOSIS — F329 Major depressive disorder, single episode, unspecified: Secondary | ICD-10-CM | POA: Diagnosis not present

## 2019-10-19 DIAGNOSIS — T1491XA Suicide attempt, initial encounter: Secondary | ICD-10-CM | POA: Insufficient documentation

## 2019-10-19 DIAGNOSIS — E278 Other specified disorders of adrenal gland: Secondary | ICD-10-CM | POA: Insufficient documentation

## 2019-10-26 DIAGNOSIS — F329 Major depressive disorder, single episode, unspecified: Secondary | ICD-10-CM | POA: Diagnosis not present

## 2019-10-31 MED FILL — BENAZEPRIL HCL 40 MG TABLET: 40 | 90 days supply | Qty: 90 | Fill #2

## 2019-11-02 DIAGNOSIS — F329 Major depressive disorder, single episode, unspecified: Secondary | ICD-10-CM | POA: Diagnosis not present

## 2019-11-09 DIAGNOSIS — F329 Major depressive disorder, single episode, unspecified: Secondary | ICD-10-CM | POA: Diagnosis not present

## 2019-11-15 DIAGNOSIS — F329 Major depressive disorder, single episode, unspecified: Secondary | ICD-10-CM | POA: Diagnosis not present

## 2019-11-23 DIAGNOSIS — F329 Major depressive disorder, single episode, unspecified: Secondary | ICD-10-CM | POA: Diagnosis not present

## 2019-11-27 DIAGNOSIS — G4733 Obstructive sleep apnea (adult) (pediatric): Secondary | ICD-10-CM | POA: Diagnosis not present

## 2019-11-28 DIAGNOSIS — G4733 Obstructive sleep apnea (adult) (pediatric): Secondary | ICD-10-CM | POA: Diagnosis not present

## 2019-11-29 DIAGNOSIS — F329 Major depressive disorder, single episode, unspecified: Secondary | ICD-10-CM | POA: Diagnosis not present

## 2019-11-30 DIAGNOSIS — R0602 Shortness of breath: Secondary | ICD-10-CM

## 2019-11-30 DIAGNOSIS — G4730 Sleep apnea, unspecified: Secondary | ICD-10-CM | POA: Diagnosis not present

## 2019-11-30 DIAGNOSIS — E782 Mixed hyperlipidemia: Secondary | ICD-10-CM | POA: Diagnosis not present

## 2019-11-30 DIAGNOSIS — Z789 Other specified health status: Secondary | ICD-10-CM | POA: Insufficient documentation

## 2019-11-30 DIAGNOSIS — I10 Essential (primary) hypertension: Secondary | ICD-10-CM | POA: Diagnosis not present

## 2019-11-30 DIAGNOSIS — R9431 Abnormal electrocardiogram [ECG] [EKG]: Secondary | ICD-10-CM | POA: Diagnosis not present

## 2019-11-30 HISTORY — DX: Shortness of breath: R06.02

## 2019-12-07 DIAGNOSIS — F329 Major depressive disorder, single episode, unspecified: Secondary | ICD-10-CM | POA: Diagnosis not present

## 2019-12-09 DIAGNOSIS — Z6835 Body mass index (BMI) 35.0-35.9, adult: Secondary | ICD-10-CM | POA: Diagnosis not present

## 2019-12-09 DIAGNOSIS — J310 Chronic rhinitis: Secondary | ICD-10-CM | POA: Diagnosis not present

## 2019-12-14 DIAGNOSIS — F329 Major depressive disorder, single episode, unspecified: Secondary | ICD-10-CM | POA: Diagnosis not present

## 2019-12-21 ENCOUNTER — Encounter: Payer: Self-pay | Admitting: Family Medicine

## 2019-12-21 DIAGNOSIS — Z9049 Acquired absence of other specified parts of digestive tract: Secondary | ICD-10-CM | POA: Insufficient documentation

## 2019-12-21 DIAGNOSIS — F329 Major depressive disorder, single episode, unspecified: Secondary | ICD-10-CM | POA: Diagnosis not present

## 2019-12-27 ENCOUNTER — Other Ambulatory Visit: Payer: Self-pay

## 2019-12-27 ENCOUNTER — Ambulatory Visit (HOSPITAL_COMMUNITY)
Admission: EM | Admit: 2019-12-27 | Discharge: 2019-12-27 | Disposition: A | Discharge status: Home / Self Care | Payer: BC Managed Care – PPO | Attending: Emergency Medicine | Admitting: Emergency Medicine

## 2019-12-27 ENCOUNTER — Encounter (HOSPITAL_COMMUNITY): Payer: Self-pay | Admitting: Emergency Medicine

## 2019-12-27 DIAGNOSIS — J0141 Acute recurrent pansinusitis: Secondary | ICD-10-CM | POA: Diagnosis not present

## 2019-12-27 DIAGNOSIS — K432 Incisional hernia without obstruction or gangrene: Secondary | ICD-10-CM | POA: Diagnosis not present

## 2019-12-27 DIAGNOSIS — Z20822 Contact with and (suspected) exposure to covid-19: Secondary | ICD-10-CM | POA: Diagnosis not present

## 2019-12-27 LAB — SARS CORONAVIRUS 2 (TAT 6-24 HRS): SARS Coronavirus 2: NEGATIVE

## 2019-12-27 MED ORDER — AMOXICILLIN-POT CLAVULANATE 875-125 MG PO TABS: 1.0000 | ORAL_TABLET | Freq: Two times a day (BID) | ORAL | 0 refills | Status: DC
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
## 2019-12-27 NOTE — ED Triage Notes (Signed)
Pt presents with sinus pain and pressure and post nasal drip xs 4-5 days. States was tested them am at a pharmacy for [place] with negative results.

## 2019-12-27 NOTE — Discharge Instructions (Addendum)
Start Mucinex.  Continue saline nasal irrigation with a Milta Deiters med rinse and distilled water as often as you want.  Continue Flonase and ipratropium nasal spray.  I would wait to fill the Augmentin.  You may get better with the Mucinex, saline nasal irrigation, nasal sprays.  Go ahead and fill it if you start having a fever, if you get better and then get worse, or have this for more than 10 days.  Most sinus infections are viral and will not get better with antibiotics.  Follow-up with your general surgeon as soon as you can for your hernia.  This may need to be repaired electively.  In the meantime, avoid heavy lifting, constipation.  Drink plenty of water and take MiraLAX or another stool softener if necessary.

## 2019-12-28 DIAGNOSIS — D3501 Benign neoplasm of right adrenal gland: Secondary | ICD-10-CM | POA: Diagnosis not present

## 2019-12-28 DIAGNOSIS — I10 Essential (primary) hypertension: Secondary | ICD-10-CM | POA: Diagnosis not present

## 2019-12-28 DIAGNOSIS — G4733 Obstructive sleep apnea (adult) (pediatric): Secondary | ICD-10-CM | POA: Diagnosis not present

## 2020-01-04 DIAGNOSIS — K439 Ventral hernia without obstruction or gangrene: Secondary | ICD-10-CM | POA: Diagnosis not present

## 2020-01-05 DIAGNOSIS — D3501 Benign neoplasm of right adrenal gland: Secondary | ICD-10-CM | POA: Diagnosis not present

## 2020-01-10 DIAGNOSIS — N433 Hydrocele, unspecified: Secondary | ICD-10-CM | POA: Diagnosis not present

## 2020-01-11 DIAGNOSIS — F329 Major depressive disorder, single episode, unspecified: Secondary | ICD-10-CM | POA: Diagnosis not present

## 2020-01-17 DIAGNOSIS — N5082 Scrotal pain: Secondary | ICD-10-CM | POA: Diagnosis not present

## 2020-01-17 DIAGNOSIS — N4341 Spermatocele of epididymis, single: Secondary | ICD-10-CM | POA: Diagnosis not present

## 2020-01-17 DIAGNOSIS — N433 Hydrocele, unspecified: Secondary | ICD-10-CM | POA: Diagnosis not present

## 2020-01-18 DIAGNOSIS — F329 Major depressive disorder, single episode, unspecified: Secondary | ICD-10-CM | POA: Diagnosis not present

## 2020-01-20 MED FILL — BENAZEPRIL HCL 40 MG TABLET: 40 | 90 days supply | Qty: 90 | Fill #3

## 2020-01-25 DIAGNOSIS — F329 Major depressive disorder, single episode, unspecified: Secondary | ICD-10-CM | POA: Diagnosis not present

## 2020-01-26 DIAGNOSIS — K439 Ventral hernia without obstruction or gangrene: Secondary | ICD-10-CM | POA: Diagnosis not present

## 2020-02-01 DIAGNOSIS — F329 Major depressive disorder, single episode, unspecified: Secondary | ICD-10-CM | POA: Diagnosis not present

## 2020-02-08 DIAGNOSIS — F329 Major depressive disorder, single episode, unspecified: Secondary | ICD-10-CM | POA: Diagnosis not present

## 2020-02-16 DIAGNOSIS — F329 Major depressive disorder, single episode, unspecified: Secondary | ICD-10-CM | POA: Diagnosis not present

## 2020-02-22 DIAGNOSIS — F329 Major depressive disorder, single episode, unspecified: Secondary | ICD-10-CM | POA: Diagnosis not present

## 2020-02-23 DIAGNOSIS — J310 Chronic rhinitis: Secondary | ICD-10-CM | POA: Diagnosis not present

## 2020-02-23 DIAGNOSIS — R0982 Postnasal drip: Secondary | ICD-10-CM | POA: Diagnosis not present

## 2020-02-23 DIAGNOSIS — R059 Cough, unspecified: Secondary | ICD-10-CM | POA: Diagnosis not present

## 2020-03-01 DIAGNOSIS — H9312 Tinnitus, left ear: Secondary | ICD-10-CM | POA: Diagnosis not present

## 2020-03-01 DIAGNOSIS — E7800 Pure hypercholesterolemia, unspecified: Secondary | ICD-10-CM | POA: Diagnosis not present

## 2020-03-01 DIAGNOSIS — H9202 Otalgia, left ear: Secondary | ICD-10-CM | POA: Diagnosis not present

## 2020-03-01 DIAGNOSIS — Z6838 Body mass index (BMI) 38.0-38.9, adult: Secondary | ICD-10-CM | POA: Diagnosis not present

## 2020-03-01 DIAGNOSIS — F329 Major depressive disorder, single episode, unspecified: Secondary | ICD-10-CM | POA: Diagnosis not present

## 2020-03-01 DIAGNOSIS — H903 Sensorineural hearing loss, bilateral: Secondary | ICD-10-CM | POA: Diagnosis not present

## 2020-03-01 DIAGNOSIS — Z79899 Other long term (current) drug therapy: Secondary | ICD-10-CM | POA: Diagnosis not present

## 2020-03-01 DIAGNOSIS — I10 Essential (primary) hypertension: Secondary | ICD-10-CM | POA: Diagnosis not present

## 2020-03-02 DIAGNOSIS — F32A Depression, unspecified: Secondary | ICD-10-CM | POA: Diagnosis not present

## 2020-03-02 DIAGNOSIS — K219 Gastro-esophageal reflux disease without esophagitis: Secondary | ICD-10-CM | POA: Diagnosis not present

## 2020-03-02 DIAGNOSIS — F419 Anxiety disorder, unspecified: Secondary | ICD-10-CM | POA: Diagnosis not present

## 2020-03-02 DIAGNOSIS — R0789 Other chest pain: Secondary | ICD-10-CM | POA: Diagnosis not present

## 2020-03-02 DIAGNOSIS — G4730 Sleep apnea, unspecified: Secondary | ICD-10-CM | POA: Diagnosis not present

## 2020-03-02 DIAGNOSIS — R0602 Shortness of breath: Secondary | ICD-10-CM | POA: Diagnosis not present

## 2020-03-02 DIAGNOSIS — Z91048 Other nonmedicinal substance allergy status: Secondary | ICD-10-CM | POA: Diagnosis not present

## 2020-03-02 DIAGNOSIS — I10 Essential (primary) hypertension: Secondary | ICD-10-CM | POA: Diagnosis not present

## 2020-03-02 DIAGNOSIS — Z87891 Personal history of nicotine dependence: Secondary | ICD-10-CM | POA: Diagnosis not present

## 2020-03-02 DIAGNOSIS — Z79899 Other long term (current) drug therapy: Secondary | ICD-10-CM | POA: Diagnosis not present

## 2020-03-02 DIAGNOSIS — R079 Chest pain, unspecified: Secondary | ICD-10-CM | POA: Diagnosis not present

## 2020-03-07 DIAGNOSIS — E785 Hyperlipidemia, unspecified: Secondary | ICD-10-CM | POA: Diagnosis not present

## 2020-03-07 DIAGNOSIS — F329 Major depressive disorder, single episode, unspecified: Secondary | ICD-10-CM | POA: Diagnosis not present

## 2020-03-07 DIAGNOSIS — E669 Obesity, unspecified: Secondary | ICD-10-CM | POA: Diagnosis not present

## 2020-03-07 DIAGNOSIS — R079 Chest pain, unspecified: Secondary | ICD-10-CM | POA: Diagnosis not present

## 2020-03-07 DIAGNOSIS — I10 Essential (primary) hypertension: Secondary | ICD-10-CM | POA: Diagnosis not present

## 2020-03-21 DIAGNOSIS — F329 Major depressive disorder, single episode, unspecified: Secondary | ICD-10-CM | POA: Diagnosis not present

## 2020-03-27 DIAGNOSIS — G4733 Obstructive sleep apnea (adult) (pediatric): Secondary | ICD-10-CM | POA: Diagnosis not present

## 2020-03-28 DIAGNOSIS — F329 Major depressive disorder, single episode, unspecified: Secondary | ICD-10-CM | POA: Diagnosis not present

## 2020-03-29 DIAGNOSIS — I10 Essential (primary) hypertension: Secondary | ICD-10-CM | POA: Diagnosis not present

## 2020-03-29 DIAGNOSIS — E669 Obesity, unspecified: Secondary | ICD-10-CM | POA: Diagnosis not present

## 2020-03-29 DIAGNOSIS — E785 Hyperlipidemia, unspecified: Secondary | ICD-10-CM | POA: Diagnosis not present

## 2020-03-29 DIAGNOSIS — R079 Chest pain, unspecified: Secondary | ICD-10-CM | POA: Diagnosis not present

## 2020-03-30 ENCOUNTER — Other Ambulatory Visit: Payer: Self-pay | Admitting: Family Medicine

## 2020-03-30 ENCOUNTER — Encounter: Payer: Self-pay | Admitting: Family Medicine

## 2020-03-30 ENCOUNTER — Ambulatory Visit (INDEPENDENT_AMBULATORY_CARE_PROVIDER_SITE_OTHER): Payer: BC Managed Care – PPO | Admitting: Family Medicine

## 2020-03-30 ENCOUNTER — Ambulatory Visit: Payer: Medicaid Other | Admitting: Family Medicine

## 2020-03-30 ENCOUNTER — Other Ambulatory Visit: Payer: Self-pay

## 2020-03-30 VITALS — BP 114/60 | HR 80 | Ht 67.0 in | Wt 236.0 lb

## 2020-03-30 DIAGNOSIS — K219 Gastro-esophageal reflux disease without esophagitis: Secondary | ICD-10-CM | POA: Diagnosis not present

## 2020-03-30 DIAGNOSIS — I10 Essential (primary) hypertension: Secondary | ICD-10-CM | POA: Diagnosis not present

## 2020-03-30 DIAGNOSIS — Z9049 Acquired absence of other specified parts of digestive tract: Secondary | ICD-10-CM

## 2020-03-30 MED ORDER — BENAZEPRIL HCL 40 MG PO TABS: ORAL_TABLET | ORAL | 3 refills | Status: DC

## 2020-03-30 NOTE — Patient Instructions (Signed)
It was great to see you today.  Here is a quick review of the things we talked about:  Hypertension:  I have refilled you blood pressure medication for the next year.  We don't need to do any blood work for this today.  Vitamins: with your history of bowel resection, I do think it's important to stay on top of your vitamins. We'll check some labs today.  If all of your labs are normal, I will send you a message over my chart or send you a letter.  If there is anything to discuss, I will give you a phone call.
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
## 2020-03-30 NOTE — Assessment & Plan Note (Signed)
Well-controlled today.  Recent creatinine and potassium are visible through care everywhere and appropriate. -Continue Benzapril 40 mg

## 2020-03-30 NOTE — Assessment & Plan Note (Signed)
No longer taking B12 supplements. -Follow-up B12 and B6

## 2020-03-30 NOTE — Assessment & Plan Note (Signed)
Much better controlled now that he is taking both omeprazole and famotidine. -Continue omeprazole and famotidine

## 2020-04-04 DIAGNOSIS — F329 Major depressive disorder, single episode, unspecified: Secondary | ICD-10-CM | POA: Diagnosis not present

## 2020-04-06 DIAGNOSIS — E785 Hyperlipidemia, unspecified: Secondary | ICD-10-CM | POA: Diagnosis not present

## 2020-04-06 DIAGNOSIS — I10 Essential (primary) hypertension: Secondary | ICD-10-CM | POA: Diagnosis not present

## 2020-04-06 DIAGNOSIS — E669 Obesity, unspecified: Secondary | ICD-10-CM | POA: Diagnosis not present

## 2020-04-06 DIAGNOSIS — R079 Chest pain, unspecified: Secondary | ICD-10-CM | POA: Diagnosis not present

## 2020-04-06 MED FILL — BENAZEPRIL HCL 40 MG TABLET: 40 | 90 days supply | Qty: 90 | Fill #0

## 2020-04-10 LAB — VITAMIN B6: Vitamin B6: 13.7 ug/L (ref 5.3–46.7)

## 2020-04-10 LAB — VITAMIN B12: Vitamin B-12: 527 pg/mL (ref 232–1245)

## 2020-04-11 DIAGNOSIS — H918X9 Other specified hearing loss, unspecified ear: Secondary | ICD-10-CM | POA: Diagnosis not present

## 2020-04-11 DIAGNOSIS — I998 Other disorder of circulatory system: Secondary | ICD-10-CM | POA: Diagnosis not present

## 2020-04-11 DIAGNOSIS — Z0389 Encounter for observation for other suspected diseases and conditions ruled out: Secondary | ICD-10-CM | POA: Diagnosis not present

## 2020-04-11 DIAGNOSIS — H9312 Tinnitus, left ear: Secondary | ICD-10-CM | POA: Diagnosis not present

## 2020-04-11 DIAGNOSIS — H9190 Unspecified hearing loss, unspecified ear: Secondary | ICD-10-CM | POA: Diagnosis not present

## 2020-04-11 DIAGNOSIS — R9089 Other abnormal findings on diagnostic imaging of central nervous system: Secondary | ICD-10-CM | POA: Diagnosis not present

## 2020-04-25 DIAGNOSIS — F329 Major depressive disorder, single episode, unspecified: Secondary | ICD-10-CM | POA: Diagnosis not present

## 2020-05-02 DIAGNOSIS — F329 Major depressive disorder, single episode, unspecified: Secondary | ICD-10-CM | POA: Diagnosis not present

## 2020-05-07 DIAGNOSIS — H938X2 Other specified disorders of left ear: Secondary | ICD-10-CM | POA: Diagnosis not present

## 2020-05-07 DIAGNOSIS — Z6837 Body mass index (BMI) 37.0-37.9, adult: Secondary | ICD-10-CM | POA: Diagnosis not present

## 2020-05-09 DIAGNOSIS — F329 Major depressive disorder, single episode, unspecified: Secondary | ICD-10-CM | POA: Diagnosis not present

## 2020-05-16 DIAGNOSIS — F329 Major depressive disorder, single episode, unspecified: Secondary | ICD-10-CM | POA: Diagnosis not present

## 2020-05-23 DIAGNOSIS — F329 Major depressive disorder, single episode, unspecified: Secondary | ICD-10-CM | POA: Diagnosis not present

## 2020-05-30 DIAGNOSIS — F329 Major depressive disorder, single episode, unspecified: Secondary | ICD-10-CM | POA: Diagnosis not present

## 2020-06-05 DIAGNOSIS — I10 Essential (primary) hypertension: Secondary | ICD-10-CM | POA: Diagnosis not present

## 2020-06-05 DIAGNOSIS — E782 Mixed hyperlipidemia: Secondary | ICD-10-CM | POA: Diagnosis not present

## 2020-06-05 DIAGNOSIS — G4733 Obstructive sleep apnea (adult) (pediatric): Secondary | ICD-10-CM | POA: Diagnosis not present

## 2020-06-06 ENCOUNTER — Other Ambulatory Visit (HOSPITAL_COMMUNITY): Payer: Self-pay

## 2020-06-06 DIAGNOSIS — F329 Major depressive disorder, single episode, unspecified: Secondary | ICD-10-CM | POA: Diagnosis not present

## 2020-06-06 MED FILL — Benazepril HCl Tab 40 MG: ORAL | 90 days supply | Qty: 90 | Fill #0 | Status: AC

## 2020-06-11 DIAGNOSIS — I10 Essential (primary) hypertension: Secondary | ICD-10-CM | POA: Diagnosis not present

## 2020-06-11 DIAGNOSIS — G4733 Obstructive sleep apnea (adult) (pediatric): Secondary | ICD-10-CM | POA: Diagnosis not present

## 2020-06-11 DIAGNOSIS — E782 Mixed hyperlipidemia: Secondary | ICD-10-CM | POA: Diagnosis not present

## 2020-06-13 DIAGNOSIS — I10 Essential (primary) hypertension: Secondary | ICD-10-CM | POA: Diagnosis not present

## 2020-06-13 DIAGNOSIS — G4733 Obstructive sleep apnea (adult) (pediatric): Secondary | ICD-10-CM | POA: Diagnosis not present

## 2020-06-13 DIAGNOSIS — R635 Abnormal weight gain: Secondary | ICD-10-CM | POA: Diagnosis not present

## 2020-06-13 DIAGNOSIS — E669 Obesity, unspecified: Secondary | ICD-10-CM | POA: Diagnosis not present

## 2020-06-20 DIAGNOSIS — F329 Major depressive disorder, single episode, unspecified: Secondary | ICD-10-CM | POA: Diagnosis not present

## 2020-06-20 DIAGNOSIS — G4733 Obstructive sleep apnea (adult) (pediatric): Secondary | ICD-10-CM | POA: Diagnosis not present

## 2020-06-27 DIAGNOSIS — J310 Chronic rhinitis: Secondary | ICD-10-CM | POA: Diagnosis not present

## 2020-06-27 DIAGNOSIS — R059 Cough, unspecified: Secondary | ICD-10-CM | POA: Diagnosis not present

## 2020-06-27 DIAGNOSIS — R0982 Postnasal drip: Secondary | ICD-10-CM | POA: Diagnosis not present

## 2020-06-27 DIAGNOSIS — F32A Depression, unspecified: Secondary | ICD-10-CM | POA: Diagnosis not present

## 2020-07-04 DIAGNOSIS — F329 Major depressive disorder, single episode, unspecified: Secondary | ICD-10-CM | POA: Diagnosis not present

## 2020-07-11 DIAGNOSIS — F329 Major depressive disorder, single episode, unspecified: Secondary | ICD-10-CM | POA: Diagnosis not present

## 2020-07-20 DIAGNOSIS — W010XXA Fall on same level from slipping, tripping and stumbling without subsequent striking against object, initial encounter: Secondary | ICD-10-CM | POA: Diagnosis not present

## 2020-07-20 DIAGNOSIS — G4733 Obstructive sleep apnea (adult) (pediatric): Secondary | ICD-10-CM | POA: Diagnosis not present

## 2020-07-20 DIAGNOSIS — M25531 Pain in right wrist: Secondary | ICD-10-CM | POA: Diagnosis not present

## 2020-07-20 DIAGNOSIS — M19031 Primary osteoarthritis, right wrist: Secondary | ICD-10-CM | POA: Diagnosis not present

## 2020-07-20 DIAGNOSIS — M1811 Unilateral primary osteoarthritis of first carpometacarpal joint, right hand: Secondary | ICD-10-CM | POA: Diagnosis not present

## 2020-07-20 DIAGNOSIS — S66911A Strain of unspecified muscle, fascia and tendon at wrist and hand level, right hand, initial encounter: Secondary | ICD-10-CM | POA: Diagnosis not present

## 2020-07-25 DIAGNOSIS — F332 Major depressive disorder, recurrent severe without psychotic features: Secondary | ICD-10-CM | POA: Diagnosis not present

## 2020-07-26 DIAGNOSIS — R635 Abnormal weight gain: Secondary | ICD-10-CM | POA: Diagnosis not present

## 2020-08-01 DIAGNOSIS — F329 Major depressive disorder, single episode, unspecified: Secondary | ICD-10-CM | POA: Diagnosis not present

## 2020-08-06 ENCOUNTER — Other Ambulatory Visit (HOSPITAL_COMMUNITY): Payer: Self-pay

## 2020-08-06 DIAGNOSIS — J310 Chronic rhinitis: Secondary | ICD-10-CM | POA: Diagnosis not present

## 2020-08-06 DIAGNOSIS — J019 Acute sinusitis, unspecified: Secondary | ICD-10-CM | POA: Diagnosis not present

## 2020-08-06 DIAGNOSIS — R0982 Postnasal drip: Secondary | ICD-10-CM | POA: Diagnosis not present

## 2020-08-06 DIAGNOSIS — M26642 Arthritis of left temporomandibular joint: Secondary | ICD-10-CM | POA: Diagnosis not present

## 2020-08-06 MED ORDER — IPRATROPIUM BROMIDE 0.06 % NA SOLN
2.0000 | Freq: Three times a day (TID) | NASAL | 5 refills | Status: DC
  Filled 2020-08-06: qty 60, 50d supply, fill #0
  Filled 2020-10-10: qty 60, 55d supply, fill #0

## 2020-08-08 DIAGNOSIS — F331 Major depressive disorder, recurrent, moderate: Secondary | ICD-10-CM | POA: Diagnosis not present

## 2020-08-08 DIAGNOSIS — F332 Major depressive disorder, recurrent severe without psychotic features: Secondary | ICD-10-CM | POA: Diagnosis not present

## 2020-08-09 DIAGNOSIS — F332 Major depressive disorder, recurrent severe without psychotic features: Secondary | ICD-10-CM | POA: Diagnosis not present

## 2020-08-10 DIAGNOSIS — M25572 Pain in left ankle and joints of left foot: Secondary | ICD-10-CM | POA: Diagnosis not present

## 2020-08-10 DIAGNOSIS — F332 Major depressive disorder, recurrent severe without psychotic features: Secondary | ICD-10-CM | POA: Diagnosis not present

## 2020-08-10 DIAGNOSIS — I10 Essential (primary) hypertension: Secondary | ICD-10-CM | POA: Diagnosis not present

## 2020-08-10 DIAGNOSIS — M25372 Other instability, left ankle: Secondary | ICD-10-CM | POA: Diagnosis not present

## 2020-08-13 DIAGNOSIS — F332 Major depressive disorder, recurrent severe without psychotic features: Secondary | ICD-10-CM | POA: Diagnosis not present

## 2020-08-14 DIAGNOSIS — F332 Major depressive disorder, recurrent severe without psychotic features: Secondary | ICD-10-CM | POA: Diagnosis not present

## 2020-08-15 DIAGNOSIS — F331 Major depressive disorder, recurrent, moderate: Secondary | ICD-10-CM | POA: Diagnosis not present

## 2020-08-15 DIAGNOSIS — F332 Major depressive disorder, recurrent severe without psychotic features: Secondary | ICD-10-CM | POA: Diagnosis not present

## 2020-08-15 DIAGNOSIS — F4312 Post-traumatic stress disorder, chronic: Secondary | ICD-10-CM | POA: Diagnosis not present

## 2020-08-16 DIAGNOSIS — F332 Major depressive disorder, recurrent severe without psychotic features: Secondary | ICD-10-CM | POA: Diagnosis not present

## 2020-08-17 DIAGNOSIS — F332 Major depressive disorder, recurrent severe without psychotic features: Secondary | ICD-10-CM | POA: Diagnosis not present

## 2020-08-20 DIAGNOSIS — F332 Major depressive disorder, recurrent severe without psychotic features: Secondary | ICD-10-CM | POA: Diagnosis not present

## 2020-08-20 DIAGNOSIS — G4733 Obstructive sleep apnea (adult) (pediatric): Secondary | ICD-10-CM | POA: Diagnosis not present

## 2020-08-21 DIAGNOSIS — F332 Major depressive disorder, recurrent severe without psychotic features: Secondary | ICD-10-CM | POA: Diagnosis not present

## 2020-08-22 DIAGNOSIS — I10 Essential (primary) hypertension: Secondary | ICD-10-CM | POA: Diagnosis not present

## 2020-08-22 DIAGNOSIS — F332 Major depressive disorder, recurrent severe without psychotic features: Secondary | ICD-10-CM | POA: Diagnosis not present

## 2020-08-22 DIAGNOSIS — E669 Obesity, unspecified: Secondary | ICD-10-CM | POA: Diagnosis not present

## 2020-08-22 DIAGNOSIS — Z6833 Body mass index (BMI) 33.0-33.9, adult: Secondary | ICD-10-CM | POA: Diagnosis not present

## 2020-08-22 DIAGNOSIS — G4733 Obstructive sleep apnea (adult) (pediatric): Secondary | ICD-10-CM | POA: Diagnosis not present

## 2020-08-23 DIAGNOSIS — F332 Major depressive disorder, recurrent severe without psychotic features: Secondary | ICD-10-CM | POA: Diagnosis not present

## 2020-08-27 DIAGNOSIS — F332 Major depressive disorder, recurrent severe without psychotic features: Secondary | ICD-10-CM | POA: Diagnosis not present

## 2020-08-28 DIAGNOSIS — F332 Major depressive disorder, recurrent severe without psychotic features: Secondary | ICD-10-CM | POA: Diagnosis not present

## 2020-08-29 DIAGNOSIS — F332 Major depressive disorder, recurrent severe without psychotic features: Secondary | ICD-10-CM | POA: Diagnosis not present

## 2020-09-04 DIAGNOSIS — F332 Major depressive disorder, recurrent severe without psychotic features: Secondary | ICD-10-CM | POA: Diagnosis not present

## 2020-09-05 DIAGNOSIS — F332 Major depressive disorder, recurrent severe without psychotic features: Secondary | ICD-10-CM | POA: Diagnosis not present

## 2020-09-06 DIAGNOSIS — F332 Major depressive disorder, recurrent severe without psychotic features: Secondary | ICD-10-CM | POA: Diagnosis not present

## 2020-09-07 DIAGNOSIS — F332 Major depressive disorder, recurrent severe without psychotic features: Secondary | ICD-10-CM | POA: Diagnosis not present

## 2020-09-11 DIAGNOSIS — F332 Major depressive disorder, recurrent severe without psychotic features: Secondary | ICD-10-CM | POA: Diagnosis not present

## 2020-09-12 DIAGNOSIS — F332 Major depressive disorder, recurrent severe without psychotic features: Secondary | ICD-10-CM | POA: Diagnosis not present

## 2020-09-13 DIAGNOSIS — F332 Major depressive disorder, recurrent severe without psychotic features: Secondary | ICD-10-CM | POA: Diagnosis not present

## 2020-09-14 DIAGNOSIS — F332 Major depressive disorder, recurrent severe without psychotic features: Secondary | ICD-10-CM | POA: Diagnosis not present

## 2020-09-17 DIAGNOSIS — F332 Major depressive disorder, recurrent severe without psychotic features: Secondary | ICD-10-CM | POA: Diagnosis not present

## 2020-09-19 DIAGNOSIS — F331 Major depressive disorder, recurrent, moderate: Secondary | ICD-10-CM | POA: Diagnosis not present

## 2020-09-19 DIAGNOSIS — F4312 Post-traumatic stress disorder, chronic: Secondary | ICD-10-CM | POA: Diagnosis not present

## 2020-09-20 DIAGNOSIS — F331 Major depressive disorder, recurrent, moderate: Secondary | ICD-10-CM | POA: Diagnosis not present

## 2020-09-20 DIAGNOSIS — F4312 Post-traumatic stress disorder, chronic: Secondary | ICD-10-CM | POA: Diagnosis not present

## 2020-09-20 DIAGNOSIS — G4733 Obstructive sleep apnea (adult) (pediatric): Secondary | ICD-10-CM | POA: Diagnosis not present

## 2020-09-21 DIAGNOSIS — G4733 Obstructive sleep apnea (adult) (pediatric): Secondary | ICD-10-CM | POA: Diagnosis not present

## 2020-09-24 DIAGNOSIS — G4733 Obstructive sleep apnea (adult) (pediatric): Secondary | ICD-10-CM | POA: Diagnosis not present

## 2020-09-26 DIAGNOSIS — J310 Chronic rhinitis: Secondary | ICD-10-CM | POA: Diagnosis not present

## 2020-10-10 ENCOUNTER — Other Ambulatory Visit (HOSPITAL_COMMUNITY): Payer: Self-pay

## 2020-10-16 DIAGNOSIS — F4312 Post-traumatic stress disorder, chronic: Secondary | ICD-10-CM | POA: Diagnosis not present

## 2020-10-16 DIAGNOSIS — F331 Major depressive disorder, recurrent, moderate: Secondary | ICD-10-CM | POA: Diagnosis not present

## 2020-10-22 ENCOUNTER — Other Ambulatory Visit (HOSPITAL_COMMUNITY): Payer: Self-pay

## 2020-10-22 MED FILL — Benazepril HCl Tab 40 MG: ORAL | 90 days supply | Qty: 90 | Fill #1 | Status: AC

## 2020-10-25 DIAGNOSIS — E7849 Other hyperlipidemia: Secondary | ICD-10-CM | POA: Diagnosis not present

## 2020-10-25 DIAGNOSIS — J984 Other disorders of lung: Secondary | ICD-10-CM | POA: Insufficient documentation

## 2020-10-25 DIAGNOSIS — F3340 Major depressive disorder, recurrent, in remission, unspecified: Secondary | ICD-10-CM | POA: Diagnosis not present

## 2020-10-25 DIAGNOSIS — E230 Hypopituitarism: Secondary | ICD-10-CM | POA: Insufficient documentation

## 2020-10-25 DIAGNOSIS — G4733 Obstructive sleep apnea (adult) (pediatric): Secondary | ICD-10-CM | POA: Diagnosis not present

## 2020-10-25 DIAGNOSIS — E785 Hyperlipidemia, unspecified: Secondary | ICD-10-CM | POA: Insufficient documentation

## 2020-10-25 DIAGNOSIS — K219 Gastro-esophageal reflux disease without esophagitis: Secondary | ICD-10-CM | POA: Diagnosis not present

## 2020-10-30 DIAGNOSIS — R5383 Other fatigue: Secondary | ICD-10-CM | POA: Diagnosis not present

## 2020-10-30 DIAGNOSIS — Z13220 Encounter for screening for lipoid disorders: Secondary | ICD-10-CM | POA: Diagnosis not present

## 2020-10-30 DIAGNOSIS — Z125 Encounter for screening for malignant neoplasm of prostate: Secondary | ICD-10-CM | POA: Diagnosis not present

## 2020-10-31 DIAGNOSIS — F331 Major depressive disorder, recurrent, moderate: Secondary | ICD-10-CM | POA: Diagnosis not present

## 2020-10-31 DIAGNOSIS — F4312 Post-traumatic stress disorder, chronic: Secondary | ICD-10-CM | POA: Diagnosis not present

## 2020-11-02 DIAGNOSIS — D649 Anemia, unspecified: Secondary | ICD-10-CM | POA: Diagnosis not present

## 2020-11-06 DIAGNOSIS — E785 Hyperlipidemia, unspecified: Secondary | ICD-10-CM | POA: Diagnosis not present

## 2020-11-06 DIAGNOSIS — K432 Incisional hernia without obstruction or gangrene: Secondary | ICD-10-CM | POA: Diagnosis not present

## 2020-11-06 DIAGNOSIS — I10 Essential (primary) hypertension: Secondary | ICD-10-CM | POA: Diagnosis not present

## 2020-11-06 DIAGNOSIS — F32A Depression, unspecified: Secondary | ICD-10-CM | POA: Diagnosis not present

## 2020-11-09 DIAGNOSIS — D3501 Benign neoplasm of right adrenal gland: Secondary | ICD-10-CM | POA: Diagnosis not present

## 2020-11-09 DIAGNOSIS — Z9049 Acquired absence of other specified parts of digestive tract: Secondary | ICD-10-CM | POA: Diagnosis not present

## 2020-11-09 DIAGNOSIS — N200 Calculus of kidney: Secondary | ICD-10-CM | POA: Diagnosis not present

## 2020-11-09 DIAGNOSIS — K429 Umbilical hernia without obstruction or gangrene: Secondary | ICD-10-CM | POA: Diagnosis not present

## 2020-11-10 DIAGNOSIS — N281 Cyst of kidney, acquired: Secondary | ICD-10-CM | POA: Insufficient documentation

## 2020-11-13 DIAGNOSIS — F331 Major depressive disorder, recurrent, moderate: Secondary | ICD-10-CM | POA: Diagnosis not present

## 2020-11-13 DIAGNOSIS — F4312 Post-traumatic stress disorder, chronic: Secondary | ICD-10-CM | POA: Diagnosis not present

## 2020-11-14 DIAGNOSIS — I10 Essential (primary) hypertension: Secondary | ICD-10-CM | POA: Diagnosis not present

## 2020-11-14 DIAGNOSIS — Z9989 Dependence on other enabling machines and devices: Secondary | ICD-10-CM | POA: Diagnosis not present

## 2020-11-14 DIAGNOSIS — G4733 Obstructive sleep apnea (adult) (pediatric): Secondary | ICD-10-CM | POA: Diagnosis not present

## 2020-11-14 DIAGNOSIS — R635 Abnormal weight gain: Secondary | ICD-10-CM | POA: Diagnosis not present

## 2020-11-28 DIAGNOSIS — F4312 Post-traumatic stress disorder, chronic: Secondary | ICD-10-CM | POA: Diagnosis not present

## 2020-11-28 DIAGNOSIS — F331 Major depressive disorder, recurrent, moderate: Secondary | ICD-10-CM | POA: Diagnosis not present

## 2020-12-12 DIAGNOSIS — F331 Major depressive disorder, recurrent, moderate: Secondary | ICD-10-CM | POA: Diagnosis not present

## 2020-12-12 DIAGNOSIS — F4312 Post-traumatic stress disorder, chronic: Secondary | ICD-10-CM | POA: Diagnosis not present

## 2020-12-18 DIAGNOSIS — G4733 Obstructive sleep apnea (adult) (pediatric): Secondary | ICD-10-CM | POA: Diagnosis not present

## 2020-12-27 DIAGNOSIS — N4341 Spermatocele of epididymis, single: Secondary | ICD-10-CM | POA: Diagnosis not present

## 2020-12-27 DIAGNOSIS — E349 Endocrine disorder, unspecified: Secondary | ICD-10-CM | POA: Diagnosis not present

## 2020-12-27 DIAGNOSIS — D3501 Benign neoplasm of right adrenal gland: Secondary | ICD-10-CM | POA: Diagnosis not present

## 2021-01-08 DIAGNOSIS — Z8719 Personal history of other diseases of the digestive system: Secondary | ICD-10-CM | POA: Insufficient documentation

## 2021-01-08 DIAGNOSIS — K439 Ventral hernia without obstruction or gangrene: Secondary | ICD-10-CM | POA: Insufficient documentation

## 2021-01-17 ENCOUNTER — Other Ambulatory Visit (HOSPITAL_COMMUNITY): Payer: Self-pay

## 2021-01-17 MED FILL — Benazepril HCl Tab 40 MG: ORAL | 90 days supply | Qty: 90 | Fill #2 | Status: AC

## 2021-01-22 ENCOUNTER — Other Ambulatory Visit (HOSPITAL_COMMUNITY): Payer: Self-pay

## 2021-01-22 MED ORDER — TESTOSTERONE 20.25 MG/ACT (1.62%) TD GEL
2.0000 | Freq: Every day | TRANSDERMAL | 5 refills | Status: DC
  Filled 2021-01-22 – 2021-01-29 (×2): qty 75, 30d supply, fill #0

## 2021-01-29 ENCOUNTER — Other Ambulatory Visit (HOSPITAL_COMMUNITY): Payer: Self-pay

## 2021-01-29 MED ORDER — IPRATROPIUM BROMIDE 0.06 % NA SOLN
2.0000 | Freq: Three times a day (TID) | NASAL | 5 refills | Status: DC | PRN
  Filled 2021-01-29: qty 60, 55d supply, fill #0

## 2021-01-29 MED ORDER — IPRATROPIUM BROMIDE 0.06 % NA SOLN
2.0000 | Freq: Three times a day (TID) | NASAL | 5 refills | Status: DC
  Filled 2021-01-29: qty 60, 55d supply, fill #0

## 2021-01-30 ENCOUNTER — Other Ambulatory Visit (HOSPITAL_COMMUNITY): Payer: Self-pay

## 2021-02-12 ENCOUNTER — Telehealth: Payer: Self-pay | Admitting: *Deleted

## 2021-02-12 NOTE — Telephone Encounter (Signed)
Transition Care Management Follow-up Telephone Call Date of discharge and from where: 02/11/2021 - [facility] How have you been since you were released from the hospital? "Doing okay, a little sore" Any questions or concerns? No  Items Reviewed: Did the pt receive and understand the discharge instructions provided? Yes  Medications obtained and verified? Yes  Other? No  Any new allergies since your discharge? No  Dietary orders reviewed? No Do you have support at home? Yes    Functional Questionnaire: (I = Independent and D = Dependent) ADLs: I  Bathing/Dressing- I  Meal Prep- I  Eating- I  Maintaining continence- I  Transferring/Ambulation- I  Managing Meds- I  Follow up appointments reviewed:  PCP Hospital f/u appt confirmed? Yes  Scheduled to see Dr. Nita Sells on 02/20/2021 @ 1450. [facility] f/u appt confirmed? No   Are transportation arrangements needed? No  If their condition worsens, is the pt aware to call PCP or go to the Emergency Dept.? Yes Was the patient provided with contact information for the PCP's office or ED? Yes Was to pt encouraged to call back with questions or concerns? Yes

## 2021-02-18 NOTE — Patient Instructions (Addendum)
It was wonderful to see you today.  Please bring ALL of your medications with you to every visit.   Today we talked about:  -I am sending a referral to a Neuropsychologist. They should contact you for an appointment.   Thank you for choosing [facility].   Please call [phone] with any questions about today's appointment.  Please be sure to schedule follow up at the front  desk before you leave today.   Sharion Settler, DO PGY-2 Family Medicine
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
## 2021-02-20 ENCOUNTER — Ambulatory Visit (INDEPENDENT_AMBULATORY_CARE_PROVIDER_SITE_OTHER): Payer: BC Managed Care – PPO | Admitting: Family Medicine

## 2021-02-20 ENCOUNTER — Other Ambulatory Visit (HOSPITAL_COMMUNITY): Payer: Self-pay

## 2021-02-20 ENCOUNTER — Other Ambulatory Visit: Payer: Self-pay

## 2021-02-20 ENCOUNTER — Encounter: Payer: Self-pay | Admitting: Family Medicine

## 2021-02-20 VITALS — BP 138/76 | HR 78 | Wt 179.6 lb

## 2021-02-20 DIAGNOSIS — Z9889 Other specified postprocedural states: Secondary | ICD-10-CM

## 2021-02-20 DIAGNOSIS — R413 Other amnesia: Secondary | ICD-10-CM | POA: Insufficient documentation

## 2021-02-20 DIAGNOSIS — R0982 Postnasal drip: Secondary | ICD-10-CM | POA: Insufficient documentation

## 2021-02-20 DIAGNOSIS — Z0000 Encounter for general adult medical examination without abnormal findings: Secondary | ICD-10-CM | POA: Insufficient documentation

## 2021-02-20 DIAGNOSIS — J329 Chronic sinusitis, unspecified: Secondary | ICD-10-CM | POA: Insufficient documentation

## 2021-02-20 DIAGNOSIS — H9319 Tinnitus, unspecified ear: Secondary | ICD-10-CM | POA: Insufficient documentation

## 2021-02-20 DIAGNOSIS — T1491XA Suicide attempt, initial encounter: Secondary | ICD-10-CM | POA: Insufficient documentation

## 2021-02-20 DIAGNOSIS — I10 Essential (primary) hypertension: Secondary | ICD-10-CM

## 2021-02-20 DIAGNOSIS — Z8719 Personal history of other diseases of the digestive system: Secondary | ICD-10-CM | POA: Insufficient documentation

## 2021-02-20 HISTORY — DX: Postnasal drip: R09.82

## 2021-02-20 HISTORY — DX: Encounter for general adult medical examination without abnormal findings: Z00.00

## 2021-02-20 MED ORDER — BENAZEPRIL HCL 40 MG PO TABS
40.0000 mg | ORAL_TABLET | Freq: Every day | ORAL | 3 refills | Status: DC
Start: 2021-02-20 — End: 2022-02-19
  Filled 2021-02-20 – 2021-04-15 (×2): qty 90, 90d supply, fill #0
  Filled 2021-07-14: qty 90, 90d supply, fill #1
  Filled 2021-10-09: qty 90, 90d supply, fill #2
  Filled 2022-01-06: qty 90, 90d supply, fill #3

## 2021-02-20 MED ORDER — ZOSTER VAC RECOMB ADJUVANTED 50 MCG/0.5ML IM SUSR
0.5000 mL | Freq: Once | INTRAMUSCULAR | 0 refills | Status: DC
  Filled 2021-02-20: qty 0.5, 1d supply, fill #0
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
## 2021-02-20 NOTE — Assessment & Plan Note (Signed)
Sent prescription for shingles vaccination.  Cautioned against common side effects.

## 2021-02-20 NOTE — Assessment & Plan Note (Signed)
On benazepril 40 mg daily. BP 138/76, slightly above target of 130/80.  Refilled medication today.

## 2021-02-20 NOTE — Assessment & Plan Note (Signed)
Recent surgery earlier this month. Incision appears well controlled. He is on pain medication currently for post-op pain.  -F/u with surgeon as scheduled

## 2021-03-04 ENCOUNTER — Other Ambulatory Visit: Payer: Self-pay

## 2021-04-15 ENCOUNTER — Other Ambulatory Visit (HOSPITAL_COMMUNITY): Payer: Self-pay

## 2021-04-15 MED ORDER — ZOSTER VAC RECOMB ADJUVANTED 50 MCG/0.5ML IM SUSR
0.5000 mL | INTRAMUSCULAR | 1 refills | Status: DC
  Filled 2021-04-15: qty 0.5, 1d supply, fill #0
  Filled 2021-06-25 – 2021-07-01 (×2): qty 0.5, 1d supply, fill #1

## 2021-04-16 ENCOUNTER — Other Ambulatory Visit (HOSPITAL_COMMUNITY): Payer: Self-pay

## 2021-05-03 DIAGNOSIS — M94262 Chondromalacia, left knee: Secondary | ICD-10-CM | POA: Insufficient documentation

## 2021-05-03 DIAGNOSIS — M25562 Pain in left knee: Secondary | ICD-10-CM | POA: Insufficient documentation

## 2021-05-06 DIAGNOSIS — Z7409 Other reduced mobility: Secondary | ICD-10-CM | POA: Diagnosis not present

## 2021-05-06 DIAGNOSIS — M25562 Pain in left knee: Secondary | ICD-10-CM | POA: Diagnosis not present

## 2021-05-06 DIAGNOSIS — M94262 Chondromalacia, left knee: Secondary | ICD-10-CM | POA: Diagnosis not present

## 2021-05-06 DIAGNOSIS — K439 Ventral hernia without obstruction or gangrene: Secondary | ICD-10-CM | POA: Diagnosis not present

## 2021-05-08 DIAGNOSIS — Z7409 Other reduced mobility: Secondary | ICD-10-CM | POA: Diagnosis not present

## 2021-05-08 DIAGNOSIS — K439 Ventral hernia without obstruction or gangrene: Secondary | ICD-10-CM | POA: Diagnosis not present

## 2021-05-08 DIAGNOSIS — M94262 Chondromalacia, left knee: Secondary | ICD-10-CM | POA: Diagnosis not present

## 2021-05-13 DIAGNOSIS — M94262 Chondromalacia, left knee: Secondary | ICD-10-CM | POA: Diagnosis not present

## 2021-05-13 DIAGNOSIS — Z7409 Other reduced mobility: Secondary | ICD-10-CM | POA: Diagnosis not present

## 2021-05-13 DIAGNOSIS — M25562 Pain in left knee: Secondary | ICD-10-CM | POA: Diagnosis not present

## 2021-05-13 DIAGNOSIS — K439 Ventral hernia without obstruction or gangrene: Secondary | ICD-10-CM | POA: Diagnosis not present

## 2021-05-15 DIAGNOSIS — Z7409 Other reduced mobility: Secondary | ICD-10-CM | POA: Diagnosis not present

## 2021-05-15 DIAGNOSIS — J019 Acute sinusitis, unspecified: Secondary | ICD-10-CM | POA: Diagnosis not present

## 2021-05-15 DIAGNOSIS — K439 Ventral hernia without obstruction or gangrene: Secondary | ICD-10-CM | POA: Diagnosis not present

## 2021-05-15 DIAGNOSIS — M94262 Chondromalacia, left knee: Secondary | ICD-10-CM | POA: Diagnosis not present

## 2021-05-15 DIAGNOSIS — E7849 Other hyperlipidemia: Secondary | ICD-10-CM | POA: Diagnosis not present

## 2021-05-15 DIAGNOSIS — I10 Essential (primary) hypertension: Secondary | ICD-10-CM | POA: Diagnosis not present

## 2021-05-16 DIAGNOSIS — G4733 Obstructive sleep apnea (adult) (pediatric): Secondary | ICD-10-CM | POA: Diagnosis not present

## 2021-05-16 DIAGNOSIS — G479 Sleep disorder, unspecified: Secondary | ICD-10-CM | POA: Diagnosis not present

## 2021-05-16 DIAGNOSIS — J984 Other disorders of lung: Secondary | ICD-10-CM | POA: Diagnosis not present

## 2021-05-16 DIAGNOSIS — I10 Essential (primary) hypertension: Secondary | ICD-10-CM | POA: Diagnosis not present

## 2021-05-20 DIAGNOSIS — M94262 Chondromalacia, left knee: Secondary | ICD-10-CM | POA: Insufficient documentation

## 2021-05-20 DIAGNOSIS — M25562 Pain in left knee: Secondary | ICD-10-CM | POA: Diagnosis not present

## 2021-05-22 DIAGNOSIS — F332 Major depressive disorder, recurrent severe without psychotic features: Secondary | ICD-10-CM | POA: Diagnosis not present

## 2021-05-22 DIAGNOSIS — E785 Hyperlipidemia, unspecified: Secondary | ICD-10-CM | POA: Diagnosis not present

## 2021-05-22 DIAGNOSIS — F4312 Post-traumatic stress disorder, chronic: Secondary | ICD-10-CM | POA: Diagnosis not present

## 2021-05-22 DIAGNOSIS — F331 Major depressive disorder, recurrent, moderate: Secondary | ICD-10-CM | POA: Diagnosis not present

## 2021-05-22 DIAGNOSIS — I10 Essential (primary) hypertension: Secondary | ICD-10-CM | POA: Diagnosis not present

## 2021-05-22 DIAGNOSIS — Z7689 Persons encountering health services in other specified circumstances: Secondary | ICD-10-CM | POA: Diagnosis not present

## 2021-05-24 DIAGNOSIS — K439 Ventral hernia without obstruction or gangrene: Secondary | ICD-10-CM | POA: Diagnosis not present

## 2021-05-24 DIAGNOSIS — Z7409 Other reduced mobility: Secondary | ICD-10-CM | POA: Diagnosis not present

## 2021-05-24 DIAGNOSIS — M94262 Chondromalacia, left knee: Secondary | ICD-10-CM | POA: Diagnosis not present

## 2021-05-28 DIAGNOSIS — E230 Hypopituitarism: Secondary | ICD-10-CM | POA: Diagnosis not present

## 2021-05-29 DIAGNOSIS — F331 Major depressive disorder, recurrent, moderate: Secondary | ICD-10-CM | POA: Diagnosis not present

## 2021-05-29 DIAGNOSIS — F4312 Post-traumatic stress disorder, chronic: Secondary | ICD-10-CM | POA: Diagnosis not present

## 2021-05-30 DIAGNOSIS — Z7409 Other reduced mobility: Secondary | ICD-10-CM | POA: Diagnosis not present

## 2021-05-30 DIAGNOSIS — K439 Ventral hernia without obstruction or gangrene: Secondary | ICD-10-CM | POA: Diagnosis not present

## 2021-05-30 DIAGNOSIS — M94262 Chondromalacia, left knee: Secondary | ICD-10-CM | POA: Diagnosis not present

## 2021-06-11 ENCOUNTER — Encounter: Payer: Self-pay | Admitting: *Deleted

## 2021-06-12 DIAGNOSIS — F331 Major depressive disorder, recurrent, moderate: Secondary | ICD-10-CM | POA: Diagnosis not present

## 2021-06-12 DIAGNOSIS — F4312 Post-traumatic stress disorder, chronic: Secondary | ICD-10-CM | POA: Diagnosis not present

## 2021-06-14 DIAGNOSIS — E538 Deficiency of other specified B group vitamins: Secondary | ICD-10-CM | POA: Diagnosis not present

## 2021-06-14 DIAGNOSIS — M25562 Pain in left knee: Secondary | ICD-10-CM | POA: Diagnosis not present

## 2021-06-14 DIAGNOSIS — Z77011 Contact with and (suspected) exposure to lead: Secondary | ICD-10-CM | POA: Diagnosis not present

## 2021-06-14 DIAGNOSIS — Z131 Encounter for screening for diabetes mellitus: Secondary | ICD-10-CM | POA: Diagnosis not present

## 2021-06-18 DIAGNOSIS — G4733 Obstructive sleep apnea (adult) (pediatric): Secondary | ICD-10-CM | POA: Diagnosis not present

## 2021-06-19 DIAGNOSIS — F331 Major depressive disorder, recurrent, moderate: Secondary | ICD-10-CM | POA: Diagnosis not present

## 2021-06-19 DIAGNOSIS — F4312 Post-traumatic stress disorder, chronic: Secondary | ICD-10-CM | POA: Diagnosis not present

## 2021-06-20 DIAGNOSIS — G4733 Obstructive sleep apnea (adult) (pediatric): Secondary | ICD-10-CM | POA: Diagnosis not present

## 2021-06-25 ENCOUNTER — Other Ambulatory Visit (HOSPITAL_COMMUNITY): Payer: Self-pay

## 2021-06-25 DIAGNOSIS — K449 Diaphragmatic hernia without obstruction or gangrene: Secondary | ICD-10-CM | POA: Insufficient documentation

## 2021-06-26 DIAGNOSIS — G4733 Obstructive sleep apnea (adult) (pediatric): Secondary | ICD-10-CM | POA: Diagnosis not present

## 2021-06-26 DIAGNOSIS — R0602 Shortness of breath: Secondary | ICD-10-CM | POA: Diagnosis not present

## 2021-06-26 DIAGNOSIS — I10 Essential (primary) hypertension: Secondary | ICD-10-CM | POA: Diagnosis not present

## 2021-06-26 DIAGNOSIS — E782 Mixed hyperlipidemia: Secondary | ICD-10-CM | POA: Diagnosis not present

## 2021-06-28 DIAGNOSIS — M25562 Pain in left knee: Secondary | ICD-10-CM | POA: Diagnosis not present

## 2021-06-28 DIAGNOSIS — G8929 Other chronic pain: Secondary | ICD-10-CM | POA: Diagnosis not present

## 2021-06-28 DIAGNOSIS — Z7409 Other reduced mobility: Secondary | ICD-10-CM | POA: Diagnosis not present

## 2021-06-28 DIAGNOSIS — K439 Ventral hernia without obstruction or gangrene: Secondary | ICD-10-CM | POA: Diagnosis not present

## 2021-06-28 DIAGNOSIS — M94262 Chondromalacia, left knee: Secondary | ICD-10-CM | POA: Diagnosis not present

## 2021-06-28 DIAGNOSIS — Z789 Other specified health status: Secondary | ICD-10-CM | POA: Diagnosis not present

## 2021-07-01 ENCOUNTER — Other Ambulatory Visit (HOSPITAL_COMMUNITY): Payer: Self-pay

## 2021-07-05 DIAGNOSIS — E785 Hyperlipidemia, unspecified: Secondary | ICD-10-CM | POA: Diagnosis not present

## 2021-07-05 DIAGNOSIS — E669 Obesity, unspecified: Secondary | ICD-10-CM | POA: Diagnosis not present

## 2021-07-05 DIAGNOSIS — Z6834 Body mass index (BMI) 34.0-34.9, adult: Secondary | ICD-10-CM | POA: Diagnosis not present

## 2021-07-05 DIAGNOSIS — I10 Essential (primary) hypertension: Secondary | ICD-10-CM | POA: Diagnosis not present

## 2021-07-10 DIAGNOSIS — F331 Major depressive disorder, recurrent, moderate: Secondary | ICD-10-CM | POA: Diagnosis not present

## 2021-07-10 DIAGNOSIS — F4312 Post-traumatic stress disorder, chronic: Secondary | ICD-10-CM | POA: Diagnosis not present

## 2021-07-15 ENCOUNTER — Other Ambulatory Visit (HOSPITAL_COMMUNITY): Payer: Self-pay

## 2021-07-15 DIAGNOSIS — N433 Hydrocele, unspecified: Secondary | ICD-10-CM | POA: Diagnosis not present

## 2021-07-15 DIAGNOSIS — Z79890 Hormone replacement therapy (postmenopausal): Secondary | ICD-10-CM | POA: Diagnosis not present

## 2021-07-15 DIAGNOSIS — M170 Bilateral primary osteoarthritis of knee: Secondary | ICD-10-CM | POA: Diagnosis not present

## 2021-07-15 DIAGNOSIS — M179 Osteoarthritis of knee, unspecified: Secondary | ICD-10-CM | POA: Insufficient documentation

## 2021-07-15 DIAGNOSIS — F332 Major depressive disorder, recurrent severe without psychotic features: Secondary | ICD-10-CM | POA: Diagnosis not present

## 2021-07-15 DIAGNOSIS — M94262 Chondromalacia, left knee: Secondary | ICD-10-CM | POA: Diagnosis not present

## 2021-07-15 DIAGNOSIS — E538 Deficiency of other specified B group vitamins: Secondary | ICD-10-CM | POA: Diagnosis not present

## 2021-07-15 DIAGNOSIS — Z77011 Contact with and (suspected) exposure to lead: Secondary | ICD-10-CM | POA: Diagnosis not present

## 2021-07-16 DIAGNOSIS — Z789 Other specified health status: Secondary | ICD-10-CM | POA: Diagnosis not present

## 2021-07-16 DIAGNOSIS — K439 Ventral hernia without obstruction or gangrene: Secondary | ICD-10-CM | POA: Diagnosis not present

## 2021-07-16 DIAGNOSIS — M25562 Pain in left knee: Secondary | ICD-10-CM | POA: Diagnosis not present

## 2021-07-16 DIAGNOSIS — M94262 Chondromalacia, left knee: Secondary | ICD-10-CM | POA: Diagnosis not present

## 2021-07-16 DIAGNOSIS — Z7409 Other reduced mobility: Secondary | ICD-10-CM | POA: Diagnosis not present

## 2021-07-18 DIAGNOSIS — Z789 Other specified health status: Secondary | ICD-10-CM | POA: Diagnosis not present

## 2021-07-18 DIAGNOSIS — M94262 Chondromalacia, left knee: Secondary | ICD-10-CM | POA: Diagnosis not present

## 2021-07-18 DIAGNOSIS — M25562 Pain in left knee: Secondary | ICD-10-CM | POA: Diagnosis not present

## 2021-07-18 DIAGNOSIS — K439 Ventral hernia without obstruction or gangrene: Secondary | ICD-10-CM | POA: Diagnosis not present

## 2021-07-18 DIAGNOSIS — Z7409 Other reduced mobility: Secondary | ICD-10-CM | POA: Diagnosis not present

## 2021-07-22 DIAGNOSIS — G4733 Obstructive sleep apnea (adult) (pediatric): Secondary | ICD-10-CM | POA: Diagnosis not present

## 2021-07-25 DIAGNOSIS — K439 Ventral hernia without obstruction or gangrene: Secondary | ICD-10-CM | POA: Diagnosis not present

## 2021-07-25 DIAGNOSIS — M25562 Pain in left knee: Secondary | ICD-10-CM | POA: Diagnosis not present

## 2021-07-25 DIAGNOSIS — Z7409 Other reduced mobility: Secondary | ICD-10-CM | POA: Diagnosis not present

## 2021-07-25 DIAGNOSIS — M94262 Chondromalacia, left knee: Secondary | ICD-10-CM | POA: Diagnosis not present

## 2021-07-25 DIAGNOSIS — Z789 Other specified health status: Secondary | ICD-10-CM | POA: Diagnosis not present

## 2021-07-30 DIAGNOSIS — M25562 Pain in left knee: Secondary | ICD-10-CM | POA: Diagnosis not present

## 2021-07-30 DIAGNOSIS — Z789 Other specified health status: Secondary | ICD-10-CM | POA: Diagnosis not present

## 2021-07-30 DIAGNOSIS — M94262 Chondromalacia, left knee: Secondary | ICD-10-CM | POA: Diagnosis not present

## 2021-07-30 DIAGNOSIS — Z7409 Other reduced mobility: Secondary | ICD-10-CM | POA: Diagnosis not present

## 2021-07-30 DIAGNOSIS — K439 Ventral hernia without obstruction or gangrene: Secondary | ICD-10-CM | POA: Diagnosis not present

## 2021-07-31 DIAGNOSIS — F331 Major depressive disorder, recurrent, moderate: Secondary | ICD-10-CM | POA: Diagnosis not present

## 2021-07-31 DIAGNOSIS — F4312 Post-traumatic stress disorder, chronic: Secondary | ICD-10-CM | POA: Diagnosis not present

## 2021-08-01 DIAGNOSIS — M94262 Chondromalacia, left knee: Secondary | ICD-10-CM | POA: Diagnosis not present

## 2021-08-01 DIAGNOSIS — K439 Ventral hernia without obstruction or gangrene: Secondary | ICD-10-CM | POA: Diagnosis not present

## 2021-08-01 DIAGNOSIS — Z7409 Other reduced mobility: Secondary | ICD-10-CM | POA: Diagnosis not present

## 2021-08-01 DIAGNOSIS — M25562 Pain in left knee: Secondary | ICD-10-CM | POA: Diagnosis not present

## 2021-08-01 DIAGNOSIS — Z789 Other specified health status: Secondary | ICD-10-CM | POA: Diagnosis not present

## 2021-08-06 DIAGNOSIS — D649 Anemia, unspecified: Secondary | ICD-10-CM | POA: Diagnosis not present

## 2021-08-08 DIAGNOSIS — Z789 Other specified health status: Secondary | ICD-10-CM | POA: Diagnosis not present

## 2021-08-08 DIAGNOSIS — Z7409 Other reduced mobility: Secondary | ICD-10-CM | POA: Diagnosis not present

## 2021-08-08 DIAGNOSIS — K439 Ventral hernia without obstruction or gangrene: Secondary | ICD-10-CM | POA: Diagnosis not present

## 2021-08-08 DIAGNOSIS — M94262 Chondromalacia, left knee: Secondary | ICD-10-CM | POA: Diagnosis not present

## 2021-08-08 DIAGNOSIS — M25562 Pain in left knee: Secondary | ICD-10-CM | POA: Diagnosis not present

## 2021-08-13 DIAGNOSIS — M25562 Pain in left knee: Secondary | ICD-10-CM | POA: Diagnosis not present

## 2021-08-13 DIAGNOSIS — Z789 Other specified health status: Secondary | ICD-10-CM | POA: Diagnosis not present

## 2021-08-13 DIAGNOSIS — Z7409 Other reduced mobility: Secondary | ICD-10-CM | POA: Diagnosis not present

## 2021-08-13 DIAGNOSIS — M94262 Chondromalacia, left knee: Secondary | ICD-10-CM | POA: Diagnosis not present

## 2021-08-13 DIAGNOSIS — K439 Ventral hernia without obstruction or gangrene: Secondary | ICD-10-CM | POA: Diagnosis not present

## 2021-08-14 DIAGNOSIS — F331 Major depressive disorder, recurrent, moderate: Secondary | ICD-10-CM | POA: Diagnosis not present

## 2021-08-14 DIAGNOSIS — F4312 Post-traumatic stress disorder, chronic: Secondary | ICD-10-CM | POA: Diagnosis not present

## 2021-08-15 DIAGNOSIS — Z789 Other specified health status: Secondary | ICD-10-CM | POA: Diagnosis not present

## 2021-08-15 DIAGNOSIS — K439 Ventral hernia without obstruction or gangrene: Secondary | ICD-10-CM | POA: Diagnosis not present

## 2021-08-15 DIAGNOSIS — Z7409 Other reduced mobility: Secondary | ICD-10-CM | POA: Diagnosis not present

## 2021-08-15 DIAGNOSIS — M25562 Pain in left knee: Secondary | ICD-10-CM | POA: Diagnosis not present

## 2021-08-15 DIAGNOSIS — M94262 Chondromalacia, left knee: Secondary | ICD-10-CM | POA: Diagnosis not present

## 2021-08-20 DIAGNOSIS — Z789 Other specified health status: Secondary | ICD-10-CM | POA: Diagnosis not present

## 2021-08-20 DIAGNOSIS — K439 Ventral hernia without obstruction or gangrene: Secondary | ICD-10-CM | POA: Diagnosis not present

## 2021-08-20 DIAGNOSIS — N433 Hydrocele, unspecified: Secondary | ICD-10-CM | POA: Diagnosis not present

## 2021-08-20 DIAGNOSIS — Z7409 Other reduced mobility: Secondary | ICD-10-CM | POA: Diagnosis not present

## 2021-08-20 DIAGNOSIS — M94262 Chondromalacia, left knee: Secondary | ICD-10-CM | POA: Diagnosis not present

## 2021-08-20 DIAGNOSIS — M25562 Pain in left knee: Secondary | ICD-10-CM | POA: Diagnosis not present

## 2021-08-21 DIAGNOSIS — R1032 Left lower quadrant pain: Secondary | ICD-10-CM | POA: Diagnosis not present

## 2021-08-21 DIAGNOSIS — D509 Iron deficiency anemia, unspecified: Secondary | ICD-10-CM | POA: Diagnosis not present

## 2021-08-21 DIAGNOSIS — Z79890 Hormone replacement therapy (postmenopausal): Secondary | ICD-10-CM | POA: Diagnosis not present

## 2021-08-23 DIAGNOSIS — G4733 Obstructive sleep apnea (adult) (pediatric): Secondary | ICD-10-CM | POA: Diagnosis not present

## 2021-08-23 DIAGNOSIS — Z9989 Dependence on other enabling machines and devices: Secondary | ICD-10-CM | POA: Diagnosis not present

## 2021-08-23 DIAGNOSIS — J984 Other disorders of lung: Secondary | ICD-10-CM | POA: Diagnosis not present

## 2021-08-23 DIAGNOSIS — I10 Essential (primary) hypertension: Secondary | ICD-10-CM | POA: Diagnosis not present

## 2021-08-28 DIAGNOSIS — F331 Major depressive disorder, recurrent, moderate: Secondary | ICD-10-CM | POA: Diagnosis not present

## 2021-08-28 DIAGNOSIS — F4312 Post-traumatic stress disorder, chronic: Secondary | ICD-10-CM | POA: Diagnosis not present

## 2021-08-28 DIAGNOSIS — Z79890 Hormone replacement therapy (postmenopausal): Secondary | ICD-10-CM | POA: Diagnosis not present

## 2021-09-04 DIAGNOSIS — F331 Major depressive disorder, recurrent, moderate: Secondary | ICD-10-CM | POA: Diagnosis not present

## 2021-09-04 DIAGNOSIS — F4312 Post-traumatic stress disorder, chronic: Secondary | ICD-10-CM | POA: Diagnosis not present

## 2021-09-13 DIAGNOSIS — F4312 Post-traumatic stress disorder, chronic: Secondary | ICD-10-CM | POA: Diagnosis not present

## 2021-09-13 DIAGNOSIS — F331 Major depressive disorder, recurrent, moderate: Secondary | ICD-10-CM | POA: Diagnosis not present

## 2021-09-18 DIAGNOSIS — F331 Major depressive disorder, recurrent, moderate: Secondary | ICD-10-CM | POA: Diagnosis not present

## 2021-09-18 DIAGNOSIS — G4733 Obstructive sleep apnea (adult) (pediatric): Secondary | ICD-10-CM | POA: Diagnosis not present

## 2021-09-18 DIAGNOSIS — F4312 Post-traumatic stress disorder, chronic: Secondary | ICD-10-CM | POA: Diagnosis not present

## 2021-09-23 DIAGNOSIS — N433 Hydrocele, unspecified: Secondary | ICD-10-CM | POA: Diagnosis not present

## 2021-09-23 DIAGNOSIS — Q5529 Other congenital malformations of testis and scrotum: Secondary | ICD-10-CM | POA: Diagnosis not present

## 2021-09-25 DIAGNOSIS — F4312 Post-traumatic stress disorder, chronic: Secondary | ICD-10-CM | POA: Diagnosis not present

## 2021-09-25 DIAGNOSIS — F331 Major depressive disorder, recurrent, moderate: Secondary | ICD-10-CM | POA: Diagnosis not present

## 2021-10-04 DIAGNOSIS — Z6837 Body mass index (BMI) 37.0-37.9, adult: Secondary | ICD-10-CM | POA: Diagnosis not present

## 2021-10-04 DIAGNOSIS — R635 Abnormal weight gain: Secondary | ICD-10-CM | POA: Diagnosis not present

## 2021-10-04 DIAGNOSIS — K219 Gastro-esophageal reflux disease without esophagitis: Secondary | ICD-10-CM | POA: Diagnosis not present

## 2021-10-04 DIAGNOSIS — I10 Essential (primary) hypertension: Secondary | ICD-10-CM | POA: Diagnosis not present

## 2021-10-09 ENCOUNTER — Other Ambulatory Visit (HOSPITAL_COMMUNITY): Payer: Self-pay

## 2021-10-09 DIAGNOSIS — F4312 Post-traumatic stress disorder, chronic: Secondary | ICD-10-CM | POA: Diagnosis not present

## 2021-10-09 DIAGNOSIS — F331 Major depressive disorder, recurrent, moderate: Secondary | ICD-10-CM | POA: Diagnosis not present

## 2021-10-14 DIAGNOSIS — N433 Hydrocele, unspecified: Secondary | ICD-10-CM | POA: Diagnosis not present

## 2021-10-16 DIAGNOSIS — F331 Major depressive disorder, recurrent, moderate: Secondary | ICD-10-CM | POA: Diagnosis not present

## 2021-10-16 DIAGNOSIS — F4312 Post-traumatic stress disorder, chronic: Secondary | ICD-10-CM | POA: Diagnosis not present

## 2021-10-23 DIAGNOSIS — F331 Major depressive disorder, recurrent, moderate: Secondary | ICD-10-CM | POA: Diagnosis not present

## 2021-10-23 DIAGNOSIS — F4312 Post-traumatic stress disorder, chronic: Secondary | ICD-10-CM | POA: Diagnosis not present

## 2021-10-29 DIAGNOSIS — F331 Major depressive disorder, recurrent, moderate: Secondary | ICD-10-CM | POA: Diagnosis not present

## 2021-10-29 DIAGNOSIS — F4312 Post-traumatic stress disorder, chronic: Secondary | ICD-10-CM | POA: Diagnosis not present

## 2021-10-30 DIAGNOSIS — R338 Other retention of urine: Secondary | ICD-10-CM | POA: Diagnosis not present

## 2021-11-06 DIAGNOSIS — F331 Major depressive disorder, recurrent, moderate: Secondary | ICD-10-CM | POA: Diagnosis not present

## 2021-11-06 DIAGNOSIS — F4312 Post-traumatic stress disorder, chronic: Secondary | ICD-10-CM | POA: Diagnosis not present

## 2021-11-13 DIAGNOSIS — F331 Major depressive disorder, recurrent, moderate: Secondary | ICD-10-CM | POA: Diagnosis not present

## 2021-11-13 DIAGNOSIS — F4312 Post-traumatic stress disorder, chronic: Secondary | ICD-10-CM | POA: Diagnosis not present

## 2021-11-19 DIAGNOSIS — Z23 Encounter for immunization: Secondary | ICD-10-CM | POA: Diagnosis not present

## 2021-11-19 DIAGNOSIS — M25572 Pain in left ankle and joints of left foot: Secondary | ICD-10-CM | POA: Diagnosis not present

## 2021-11-19 DIAGNOSIS — D509 Iron deficiency anemia, unspecified: Secondary | ICD-10-CM | POA: Diagnosis not present

## 2021-11-20 DIAGNOSIS — F331 Major depressive disorder, recurrent, moderate: Secondary | ICD-10-CM | POA: Diagnosis not present

## 2021-11-20 DIAGNOSIS — F4312 Post-traumatic stress disorder, chronic: Secondary | ICD-10-CM | POA: Diagnosis not present

## 2021-11-25 DIAGNOSIS — R338 Other retention of urine: Secondary | ICD-10-CM | POA: Diagnosis not present

## 2021-12-02 DIAGNOSIS — N521 Erectile dysfunction due to diseases classified elsewhere: Secondary | ICD-10-CM | POA: Diagnosis not present

## 2021-12-02 DIAGNOSIS — N528 Other male erectile dysfunction: Secondary | ICD-10-CM | POA: Diagnosis not present

## 2021-12-03 DIAGNOSIS — E7849 Other hyperlipidemia: Secondary | ICD-10-CM | POA: Diagnosis not present

## 2021-12-10 DIAGNOSIS — M25572 Pain in left ankle and joints of left foot: Secondary | ICD-10-CM | POA: Diagnosis not present

## 2021-12-10 DIAGNOSIS — Z741 Need for assistance with personal care: Secondary | ICD-10-CM | POA: Diagnosis not present

## 2021-12-10 DIAGNOSIS — G8929 Other chronic pain: Secondary | ICD-10-CM | POA: Diagnosis not present

## 2021-12-10 DIAGNOSIS — R29898 Other symptoms and signs involving the musculoskeletal system: Secondary | ICD-10-CM | POA: Diagnosis not present

## 2021-12-10 DIAGNOSIS — Z7409 Other reduced mobility: Secondary | ICD-10-CM | POA: Diagnosis not present

## 2021-12-11 DIAGNOSIS — F331 Major depressive disorder, recurrent, moderate: Secondary | ICD-10-CM | POA: Diagnosis not present

## 2021-12-11 DIAGNOSIS — F4312 Post-traumatic stress disorder, chronic: Secondary | ICD-10-CM | POA: Diagnosis not present

## 2021-12-13 DIAGNOSIS — G4733 Obstructive sleep apnea (adult) (pediatric): Secondary | ICD-10-CM | POA: Diagnosis not present

## 2021-12-18 DIAGNOSIS — F909 Attention-deficit hyperactivity disorder, unspecified type: Secondary | ICD-10-CM | POA: Diagnosis not present

## 2021-12-19 DIAGNOSIS — F909 Attention-deficit hyperactivity disorder, unspecified type: Secondary | ICD-10-CM | POA: Diagnosis not present

## 2021-12-26 DIAGNOSIS — G8929 Other chronic pain: Secondary | ICD-10-CM | POA: Diagnosis not present

## 2021-12-26 DIAGNOSIS — Z741 Need for assistance with personal care: Secondary | ICD-10-CM | POA: Diagnosis not present

## 2021-12-26 DIAGNOSIS — R29898 Other symptoms and signs involving the musculoskeletal system: Secondary | ICD-10-CM | POA: Diagnosis not present

## 2021-12-26 DIAGNOSIS — Z7409 Other reduced mobility: Secondary | ICD-10-CM | POA: Diagnosis not present

## 2021-12-26 DIAGNOSIS — M25572 Pain in left ankle and joints of left foot: Secondary | ICD-10-CM | POA: Diagnosis not present

## 2021-12-31 DIAGNOSIS — Z7409 Other reduced mobility: Secondary | ICD-10-CM | POA: Diagnosis not present

## 2021-12-31 DIAGNOSIS — G8929 Other chronic pain: Secondary | ICD-10-CM | POA: Diagnosis not present

## 2021-12-31 DIAGNOSIS — M25572 Pain in left ankle and joints of left foot: Secondary | ICD-10-CM | POA: Diagnosis not present

## 2021-12-31 DIAGNOSIS — Z741 Need for assistance with personal care: Secondary | ICD-10-CM | POA: Diagnosis not present

## 2021-12-31 DIAGNOSIS — R29898 Other symptoms and signs involving the musculoskeletal system: Secondary | ICD-10-CM | POA: Diagnosis not present

## 2022-01-02 DIAGNOSIS — M25572 Pain in left ankle and joints of left foot: Secondary | ICD-10-CM | POA: Diagnosis not present

## 2022-01-02 DIAGNOSIS — R29898 Other symptoms and signs involving the musculoskeletal system: Secondary | ICD-10-CM | POA: Diagnosis not present

## 2022-01-02 DIAGNOSIS — G8929 Other chronic pain: Secondary | ICD-10-CM | POA: Diagnosis not present

## 2022-01-02 DIAGNOSIS — Z741 Need for assistance with personal care: Secondary | ICD-10-CM | POA: Diagnosis not present

## 2022-01-02 DIAGNOSIS — Z7409 Other reduced mobility: Secondary | ICD-10-CM | POA: Diagnosis not present

## 2022-01-07 ENCOUNTER — Other Ambulatory Visit (HOSPITAL_COMMUNITY): Payer: Self-pay

## 2022-01-07 DIAGNOSIS — M25572 Pain in left ankle and joints of left foot: Secondary | ICD-10-CM | POA: Diagnosis not present

## 2022-01-07 DIAGNOSIS — Z7409 Other reduced mobility: Secondary | ICD-10-CM | POA: Diagnosis not present

## 2022-01-08 DIAGNOSIS — F331 Major depressive disorder, recurrent, moderate: Secondary | ICD-10-CM | POA: Diagnosis not present

## 2022-01-08 DIAGNOSIS — F4312 Post-traumatic stress disorder, chronic: Secondary | ICD-10-CM | POA: Diagnosis not present

## 2022-01-09 DIAGNOSIS — M25572 Pain in left ankle and joints of left foot: Secondary | ICD-10-CM | POA: Diagnosis not present

## 2022-01-09 DIAGNOSIS — Z7409 Other reduced mobility: Secondary | ICD-10-CM | POA: Diagnosis not present

## 2022-01-14 DIAGNOSIS — Z7409 Other reduced mobility: Secondary | ICD-10-CM | POA: Diagnosis not present

## 2022-01-14 DIAGNOSIS — M25572 Pain in left ankle and joints of left foot: Secondary | ICD-10-CM | POA: Diagnosis not present

## 2022-01-15 DIAGNOSIS — F331 Major depressive disorder, recurrent, moderate: Secondary | ICD-10-CM | POA: Diagnosis not present

## 2022-01-15 DIAGNOSIS — F4312 Post-traumatic stress disorder, chronic: Secondary | ICD-10-CM | POA: Diagnosis not present

## 2022-01-17 DIAGNOSIS — R251 Tremor, unspecified: Secondary | ICD-10-CM | POA: Diagnosis not present

## 2022-01-22 DIAGNOSIS — F4312 Post-traumatic stress disorder, chronic: Secondary | ICD-10-CM | POA: Diagnosis not present

## 2022-01-22 DIAGNOSIS — F909 Attention-deficit hyperactivity disorder, unspecified type: Secondary | ICD-10-CM | POA: Diagnosis not present

## 2022-01-22 DIAGNOSIS — F331 Major depressive disorder, recurrent, moderate: Secondary | ICD-10-CM | POA: Diagnosis not present

## 2022-01-24 DIAGNOSIS — R251 Tremor, unspecified: Secondary | ICD-10-CM | POA: Diagnosis not present

## 2022-01-29 DIAGNOSIS — F4312 Post-traumatic stress disorder, chronic: Secondary | ICD-10-CM | POA: Diagnosis not present

## 2022-01-29 DIAGNOSIS — F331 Major depressive disorder, recurrent, moderate: Secondary | ICD-10-CM | POA: Diagnosis not present

## 2022-02-05 DIAGNOSIS — F4312 Post-traumatic stress disorder, chronic: Secondary | ICD-10-CM | POA: Diagnosis not present

## 2022-02-05 DIAGNOSIS — F331 Major depressive disorder, recurrent, moderate: Secondary | ICD-10-CM | POA: Diagnosis not present

## 2022-02-06 DIAGNOSIS — Z7409 Other reduced mobility: Secondary | ICD-10-CM | POA: Diagnosis not present

## 2022-02-06 DIAGNOSIS — M25572 Pain in left ankle and joints of left foot: Secondary | ICD-10-CM | POA: Diagnosis not present

## 2022-02-12 DIAGNOSIS — F331 Major depressive disorder, recurrent, moderate: Secondary | ICD-10-CM | POA: Diagnosis not present

## 2022-02-12 DIAGNOSIS — F4312 Post-traumatic stress disorder, chronic: Secondary | ICD-10-CM | POA: Diagnosis not present

## 2022-02-19 ENCOUNTER — Other Ambulatory Visit (HOSPITAL_COMMUNITY): Payer: Self-pay

## 2022-02-19 DIAGNOSIS — Z6835 Body mass index (BMI) 35.0-35.9, adult: Secondary | ICD-10-CM | POA: Diagnosis not present

## 2022-02-19 DIAGNOSIS — E509 Vitamin A deficiency, unspecified: Secondary | ICD-10-CM | POA: Diagnosis not present

## 2022-02-19 DIAGNOSIS — I10 Essential (primary) hypertension: Secondary | ICD-10-CM | POA: Diagnosis not present

## 2022-02-19 DIAGNOSIS — D509 Iron deficiency anemia, unspecified: Secondary | ICD-10-CM | POA: Diagnosis not present

## 2022-02-19 DIAGNOSIS — E669 Obesity, unspecified: Secondary | ICD-10-CM | POA: Diagnosis not present

## 2022-02-19 DIAGNOSIS — D649 Anemia, unspecified: Secondary | ICD-10-CM | POA: Diagnosis not present

## 2022-02-19 MED ORDER — BENAZEPRIL HCL 40 MG PO TABS
40.0000 mg | ORAL_TABLET | Freq: Every day | ORAL | 3 refills | Status: DC
  Filled 2022-02-19 – 2022-04-07 (×2): qty 90, 90d supply, fill #0
  Filled 2022-07-01: qty 90, 90d supply, fill #1
  Filled 2022-09-29: qty 90, 90d supply, fill #2
  Filled 2022-12-28: qty 90, 90d supply, fill #3

## 2022-02-26 DIAGNOSIS — F331 Major depressive disorder, recurrent, moderate: Secondary | ICD-10-CM | POA: Diagnosis not present

## 2022-02-26 DIAGNOSIS — F4312 Post-traumatic stress disorder, chronic: Secondary | ICD-10-CM | POA: Diagnosis not present

## 2022-03-03 DIAGNOSIS — E291 Testicular hypofunction: Secondary | ICD-10-CM | POA: Diagnosis not present

## 2022-03-03 DIAGNOSIS — N521 Erectile dysfunction due to diseases classified elsewhere: Secondary | ICD-10-CM | POA: Diagnosis not present

## 2022-03-10 ENCOUNTER — Encounter: Payer: Self-pay | Admitting: Family Medicine

## 2022-03-10 ENCOUNTER — Ambulatory Visit (INDEPENDENT_AMBULATORY_CARE_PROVIDER_SITE_OTHER): Payer: BC Managed Care – PPO | Admitting: Family Medicine

## 2022-03-10 VITALS — BP 132/72 | HR 74 | Ht 66.5 in | Wt 227.0 lb

## 2022-03-10 DIAGNOSIS — F419 Anxiety disorder, unspecified: Secondary | ICD-10-CM

## 2022-03-10 DIAGNOSIS — F32A Depression, unspecified: Secondary | ICD-10-CM

## 2022-03-10 NOTE — Patient Instructions (Signed)
It was wonderful to see you today. Unfortunately I was unable assist you today. I would encourage you to discuss with Dr. Lurlean Nanny  Thank you for choosing [facility].   Please call [phone] with any questions about today's appointment.  Please be sure to schedule follow up at the front  desk before you leave today.   Sharion Settler, DO PGY-3 Family Medicine

## 2022-03-10 NOTE — Progress Notes (Signed)
    SUBJECTIVE:   CHIEF COMPLAINT / HPI:   Charles Costa is a 57 y.o. male who presents to the [facility] clinic today to discuss the following concerns:   Psychiatry Referral States that he fired his Psychiatrist and would like another referral. Was seeing Dr. Jannette Fogo. Would not like records forwarded to his new Psychiatrist as he would like to "start over". He states he has been diagnosed with clinical depression and anxiety.   States that he was previously prescribed high dose benzos that "were not properly managed". In 12/2015 started to "drink heavily". States that his mother strongly disliked benzos and had him taper down off of 4 mg of Klonopin in 1 month.   Patient reports that he sees Dr. Lurlean Nanny as his primary care physician. Wanted to see me today as "a consultation" but not a PCP. Reports that Dr. Lurlean Nanny is also trying to get him into [facility].    He also would like revisions made to my last note in 02/2021- patient has been on disability since February 2021 and graduated with a [place] from [employer]. Apologized to patient for these errors.   PERTINENT  PMH / PSH: Depression and anxiety   OBJECTIVE:   BP 132/72   Pulse 74   Ht 5' 6.5" (1.689 m)   Wt 227 lb (103 kg)   SpO2 96%   BMI 36.09 kg/m    General: NAD, pleasant, able to participate in exam Respiratory: normal effort Psych: Normal affect and mood     03/10/2022    2:32 PM 03/10/2022    2:30 PM 02/20/2021    2:52 PM  Depression screen PHQ 2/9  Decreased Interest 0 3 1  Down, Depressed, Hopeless  3 1  PHQ - 2 Score 0 6 2  Altered sleeping  0 1  Tired, decreased energy  3 3  Change in appetite  3 0  Feeling bad or failure about yourself   2 1  Trouble concentrating  3 3  Moving slowly or fidgety/restless  0 1  Suicidal thoughts  0 0  PHQ-9 Score  17 11  Difficult doing work/chores  Extremely dIfficult Very difficult      03/10/2022    2:51 PM  GAD 7 : Generalized Anxiety Score  Nervous, Anxious, on  Edge 3  Control/stop worrying 3  Worry too much - different things 3  Trouble relaxing 2  Restless 0  Easily annoyed or irritable 1  Afraid - awful might happen 1  Total GAD 7 Score 13  Anxiety Difficulty Very difficult    ASSESSMENT/PLAN:   1. Anxiety and depression Discussed that unfortunately since we are not patients PCP would recommend he reach out to his PCP. Discussed it is not advisable to have multiple family medicine physicians as management could get mixed up and confused. He stated he would like to continue seeing Dr. Lurlean Nanny for his primary care. Will reach out to our office administration to have this changed in our EMR. In the interim also recommended he contact Psychiatry offices and work with Dr. Lurlean Nanny to find a new provider.    Sharion Settler, [place]

## 2022-03-14 DIAGNOSIS — G4733 Obstructive sleep apnea (adult) (pediatric): Secondary | ICD-10-CM | POA: Diagnosis not present

## 2022-03-20 DIAGNOSIS — Z79899 Other long term (current) drug therapy: Secondary | ICD-10-CM | POA: Diagnosis not present

## 2022-03-20 DIAGNOSIS — F332 Major depressive disorder, recurrent severe without psychotic features: Secondary | ICD-10-CM | POA: Diagnosis not present

## 2022-03-27 DIAGNOSIS — E782 Mixed hyperlipidemia: Secondary | ICD-10-CM | POA: Diagnosis not present

## 2022-03-27 DIAGNOSIS — R790 Abnormal level of blood mineral: Secondary | ICD-10-CM | POA: Diagnosis not present

## 2022-03-27 DIAGNOSIS — I10 Essential (primary) hypertension: Secondary | ICD-10-CM | POA: Diagnosis not present

## 2022-04-01 DIAGNOSIS — Z6835 Body mass index (BMI) 35.0-35.9, adult: Secondary | ICD-10-CM | POA: Diagnosis not present

## 2022-04-01 DIAGNOSIS — G4733 Obstructive sleep apnea (adult) (pediatric): Secondary | ICD-10-CM | POA: Diagnosis not present

## 2022-04-02 DIAGNOSIS — R790 Abnormal level of blood mineral: Secondary | ICD-10-CM | POA: Diagnosis not present

## 2022-04-02 DIAGNOSIS — I10 Essential (primary) hypertension: Secondary | ICD-10-CM | POA: Diagnosis not present

## 2022-04-02 DIAGNOSIS — E782 Mixed hyperlipidemia: Secondary | ICD-10-CM | POA: Diagnosis not present

## 2022-04-03 DIAGNOSIS — Z79899 Other long term (current) drug therapy: Secondary | ICD-10-CM | POA: Diagnosis not present

## 2022-04-03 DIAGNOSIS — F332 Major depressive disorder, recurrent severe without psychotic features: Secondary | ICD-10-CM | POA: Diagnosis not present

## 2022-04-07 ENCOUNTER — Other Ambulatory Visit (HOSPITAL_COMMUNITY): Payer: Self-pay

## 2022-04-14 DIAGNOSIS — I10 Essential (primary) hypertension: Secondary | ICD-10-CM | POA: Diagnosis not present

## 2022-04-14 DIAGNOSIS — R635 Abnormal weight gain: Secondary | ICD-10-CM | POA: Diagnosis not present

## 2022-04-14 DIAGNOSIS — K219 Gastro-esophageal reflux disease without esophagitis: Secondary | ICD-10-CM | POA: Diagnosis not present

## 2022-04-24 DIAGNOSIS — R790 Abnormal level of blood mineral: Secondary | ICD-10-CM | POA: Diagnosis not present

## 2022-05-08 DIAGNOSIS — F332 Major depressive disorder, recurrent severe without psychotic features: Secondary | ICD-10-CM | POA: Diagnosis not present

## 2022-05-08 DIAGNOSIS — Z79899 Other long term (current) drug therapy: Secondary | ICD-10-CM | POA: Diagnosis not present

## 2022-05-19 DIAGNOSIS — I10 Essential (primary) hypertension: Secondary | ICD-10-CM | POA: Diagnosis not present

## 2022-05-19 DIAGNOSIS — Z9989 Dependence on other enabling machines and devices: Secondary | ICD-10-CM | POA: Diagnosis not present

## 2022-05-19 DIAGNOSIS — G4733 Obstructive sleep apnea (adult) (pediatric): Secondary | ICD-10-CM | POA: Diagnosis not present

## 2022-05-19 DIAGNOSIS — J984 Other disorders of lung: Secondary | ICD-10-CM | POA: Diagnosis not present

## 2022-05-22 DIAGNOSIS — D509 Iron deficiency anemia, unspecified: Secondary | ICD-10-CM | POA: Diagnosis not present

## 2022-05-22 DIAGNOSIS — I10 Essential (primary) hypertension: Secondary | ICD-10-CM | POA: Diagnosis not present

## 2022-05-28 DIAGNOSIS — D509 Iron deficiency anemia, unspecified: Secondary | ICD-10-CM | POA: Diagnosis not present

## 2022-05-28 DIAGNOSIS — Z6835 Body mass index (BMI) 35.0-35.9, adult: Secondary | ICD-10-CM | POA: Diagnosis not present

## 2022-05-28 DIAGNOSIS — I10 Essential (primary) hypertension: Secondary | ICD-10-CM | POA: Diagnosis not present

## 2022-05-30 IMAGING — CT CT ABDOMEN WO/W CM
2 of 10 series · 8 of 46 positions shown, 14 images · IV contrast (iopamidol)
Comparison: No comparison available.

CLINICAL DATA: Adrenal mass.

EXAM:
CT ABDOMEN WITHOUT AND WITH CONTRAST
TECHNIQUE: Multidetector CT imaging of the abdomen was performed following the
standard protocol before and following the bolus administration of
intravenous contrast.
CONTRAST:  100mL WUD63I-A00 IOPAMIDOL (WUD63I-A00) INJECTION 61%

[Series 6: adrenals portal venous 3.00 br40 s3 axial cm · axial · portal-venous · 0.83mm/px · z∈[+1411,+1615]mm · 5 of 104 slices shown, 10 images]
[im 18/104  soft-tissue]
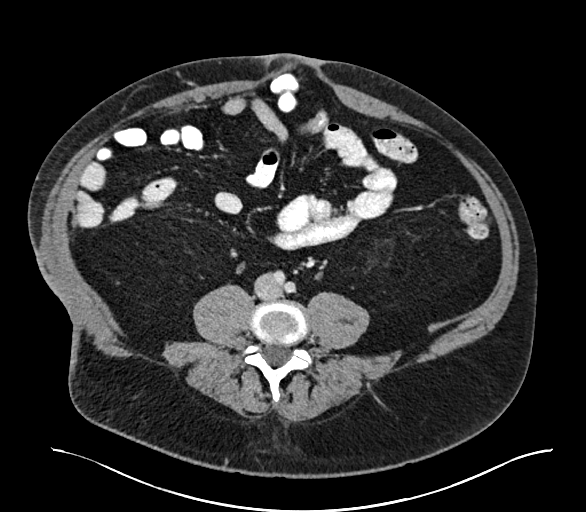
[im 18/104  bone]
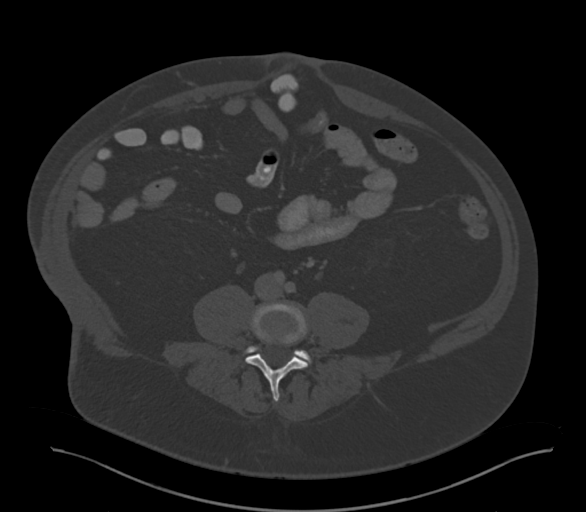
[im 35/104  soft-tissue]
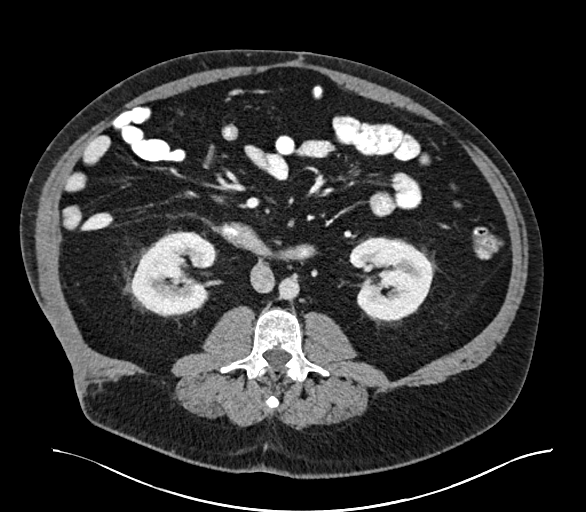
[im 35/104  lung]
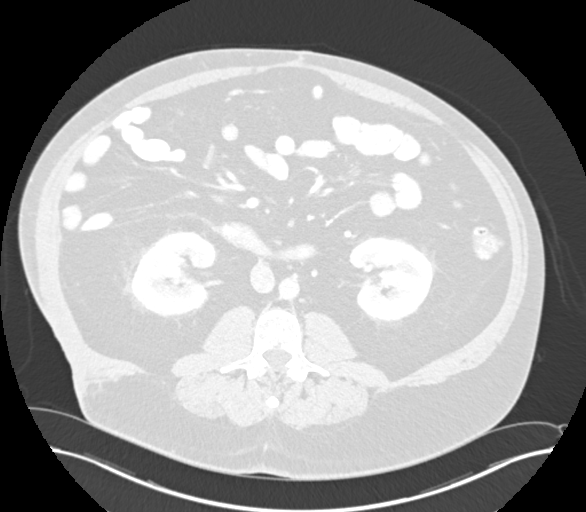
[im 52/104  soft-tissue]
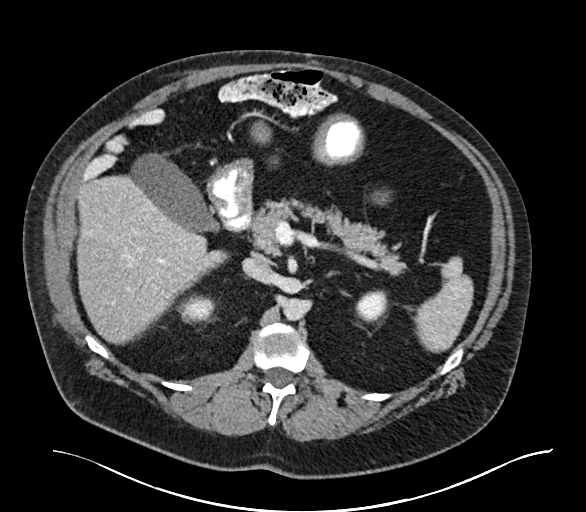
[im 52/104  lung]
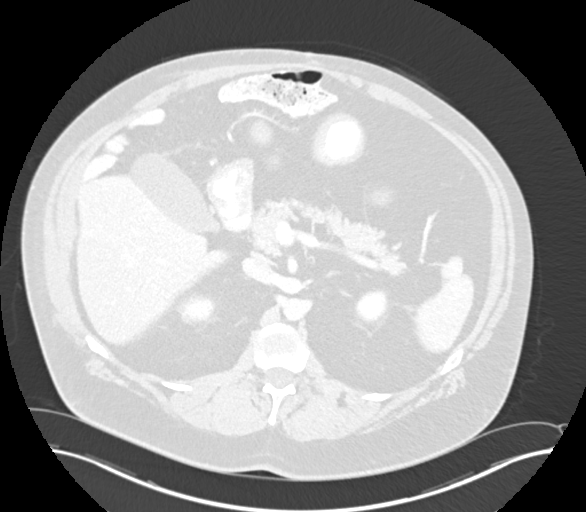
[im 69/104  soft-tissue]
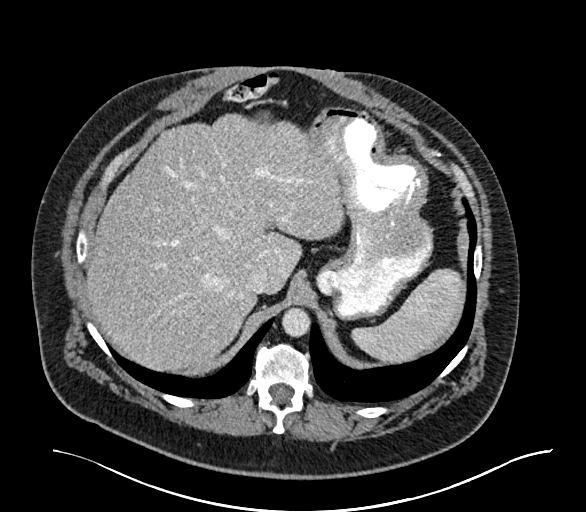
[im 69/104  lung]
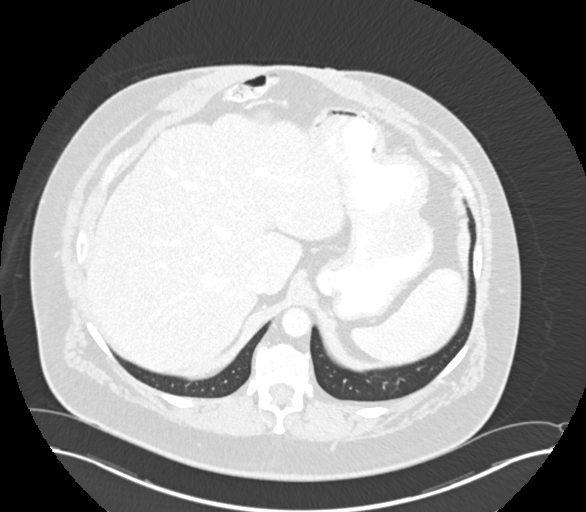
[im 86/104  soft-tissue]
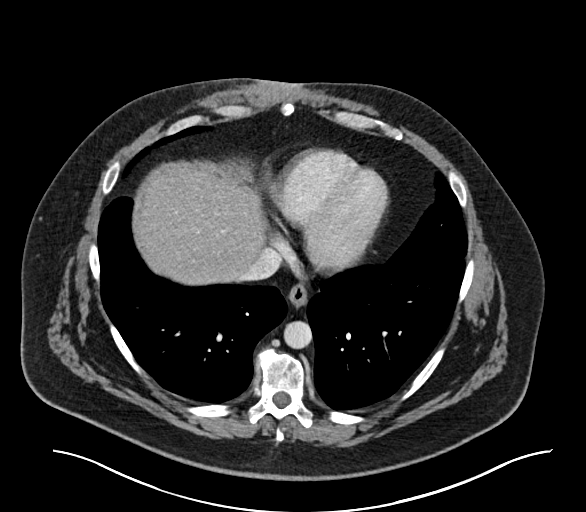
[im 86/104  lung]
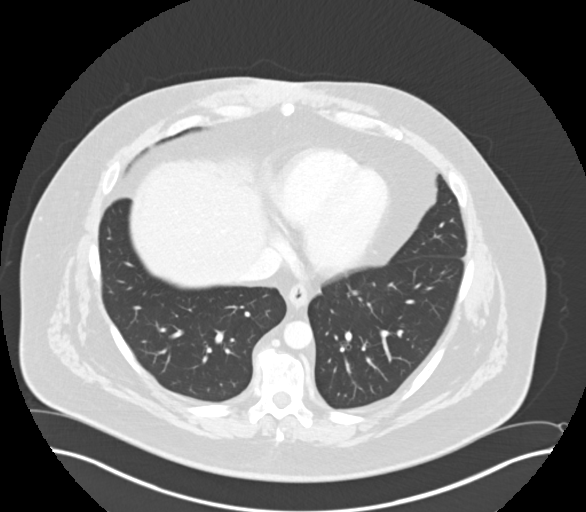

[Series 8: adrenals portal venous 3.00 br40 s3 cor cm · coronal · portal-venous · 0.61mm/px · 3 of 141 slices shown, 4 images]
[im 36/141  soft-tissue]
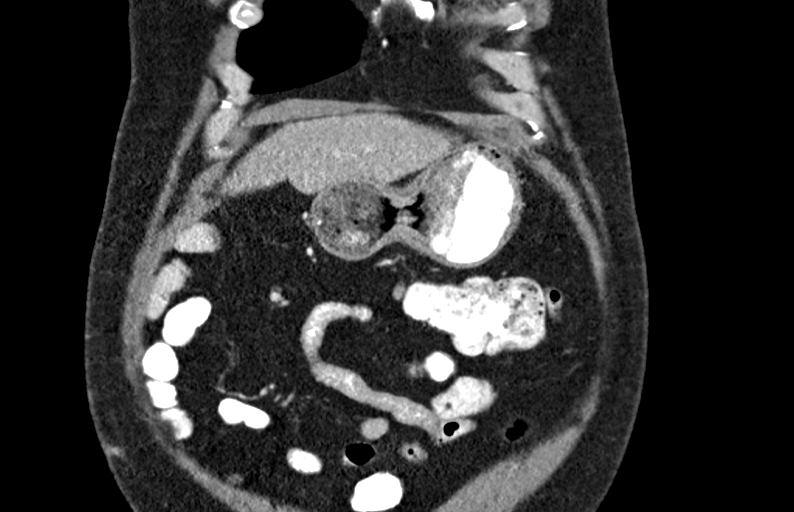
[im 71/141  soft-tissue]
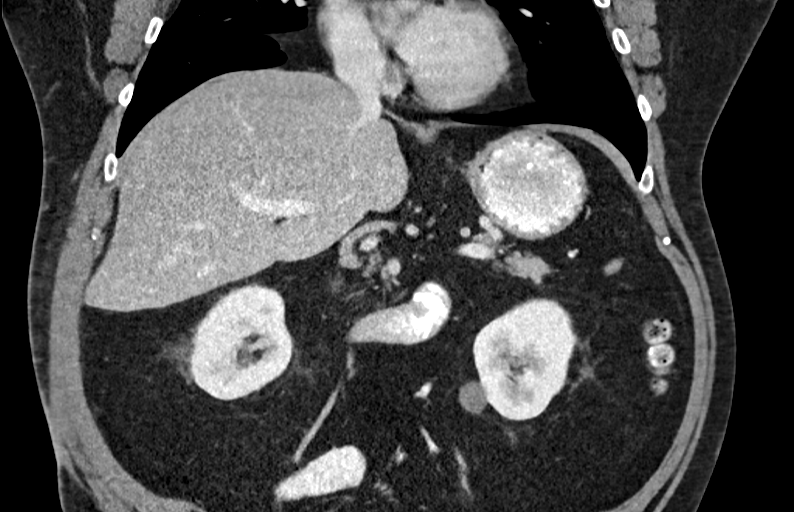
[im 71/141  bone]
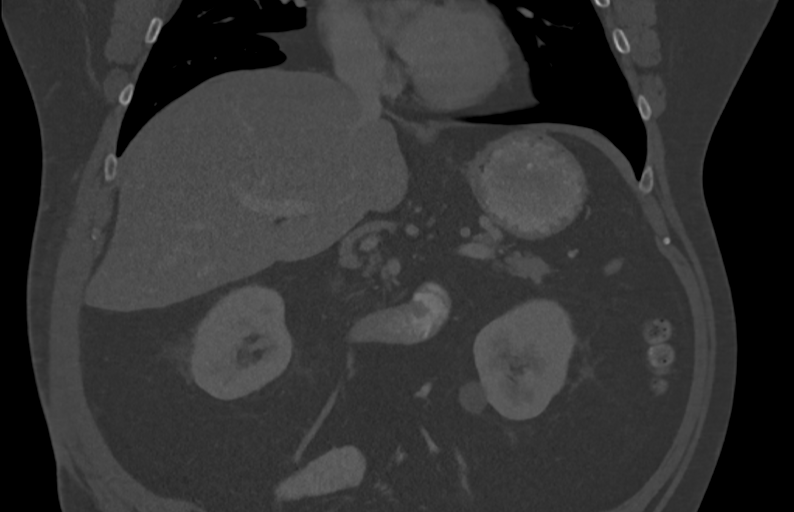
[im 106/141  soft-tissue]
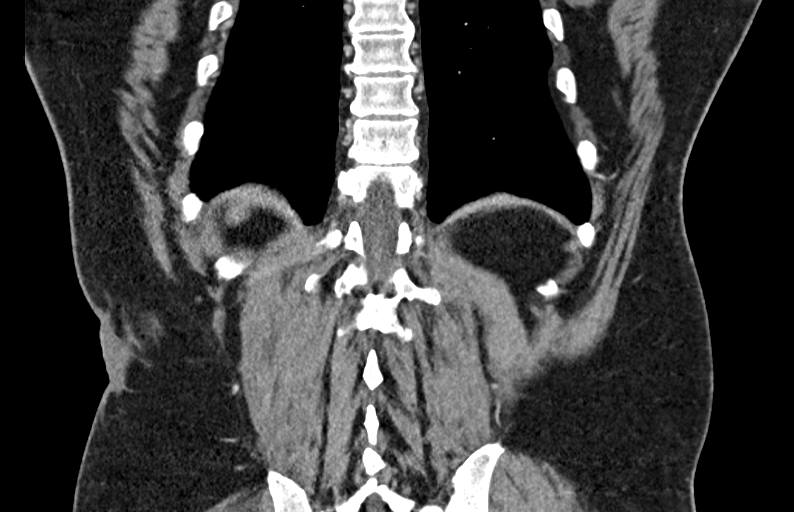

[8 of 46 positions shown; findings below may reference images not displayed]

FINDINGS: Lower chest: Incidental imaging of the lung bases is normal. No
effusion. No consolidation.

Hepatobiliary: Lobular hepatic contours.

Low attenuation of hepatic parenchyma measuring approximately 54
Hounsfield units slightly less than that of the spleen. No
pericholecystic stranding., no suspicious focal hepatic lesion.
Small cyst in the lateral segment of the LEFT hepatic lobe. Portal
vein is patent. No biliary duct dilation. No pericholecystic
stranding.

Pancreas: Pancreas is normal without ductal dilation or
inflammation.

Spleen: Spleen normal in size and contour with small adjacent
splenule. No focal lesion.

Adrenals/Urinary Tract:

LEFT adrenal gland is normal.

Arising from the RIGHT adrenal gland is a smoothly marginated 2.0 x
1.9 cm lesion displaying homogeneous density. Baseline noncontrast
density 31 Hounsfield units. Post-contrast venous phase 91
Hounsfield units. Delayed phase with density of 49 Hounsfield units.

No hydronephrosis. Symmetric renal enhancement.

Small lesion arising from the lower pole the LEFT kidney measuring
2.4 x 2.2 cm precontrast density 43 Hounsfield units. Post-contrast
density of 49 Hounsfield units on venous phase with delayed phase
density of 48 Hounsfield units. No evidence of enhancement with
well-circumscribed margins.

Mild perinephric stranding bilaterally.

Stomach/Bowel: No acute gastrointestinal process. Small bowel
contain in a moderately wide mouth, 4 by 3 cm with ventral hernia in
the anterior abdomen. Pelvic bowel loops are not imaged. Changes of
RIGHT hemicolectomy. No acute bowel process. Thinning of abdominal
wall musculature thickening of the skin, blending with subcutaneous
fat in the sub xiphoid region likely from prior surgery

Soft tissue thickening along the RIGHT flank also may reflect
previous inflammation, reportedly there was fluid in this location
on prior imaging. Images not available for review but report
available in [REDACTED].

Vascular/Lymphatic: Vascular structures in the abdomen are patent.
No adenopathy in the retroperitoneum or upper abdomen.

Other: No ascites. Postoperative changes as described. Abdominal
wall hernia also as described above.

Musculoskeletal: No acute or destructive bone finding. Spinal
degenerative changes.
IMPRESSION: 1. Small RIGHT adrenal lesion, well-circumscribed and homogeneous
approximately 2 cm with washout characteristics compatible with
adrenal adenoma.
2. LEFT renal cyst with hemorrhagic or proteinaceous features
3. Lobular hepatic contours and hepatic steatosis, correlate with
any clinical or laboratory evidence of liver disease.
4. Postoperative changes of RIGHT hemicolectomy. Postoperative
changes and presumed granulation tissue associated with the RIGHT
body wall and subxiphoid anterior abdominal wall, also with ventral
hernia containing small bowel loops. No obstruction.

Aortic Atherosclerosis ([id number]).

## 2022-06-04 IMAGING — US US RENAL
1 series · 14 of 25 positions shown · non-contrast
Comparison: CT abdomen and pelvis 09/08/2019

CLINICAL DATA: Renal mass, follow-up

EXAM:
RENAL / URINARY TRACT ULTRASOUND COMPLETE

[Series 1: us renal · 0.26mm/px · 14 of 36 slices shown]
[im 1/36]
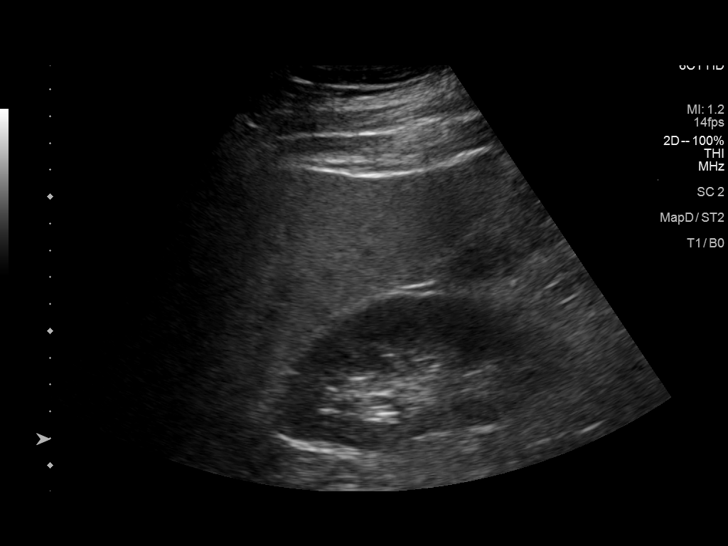
[im 3/36]
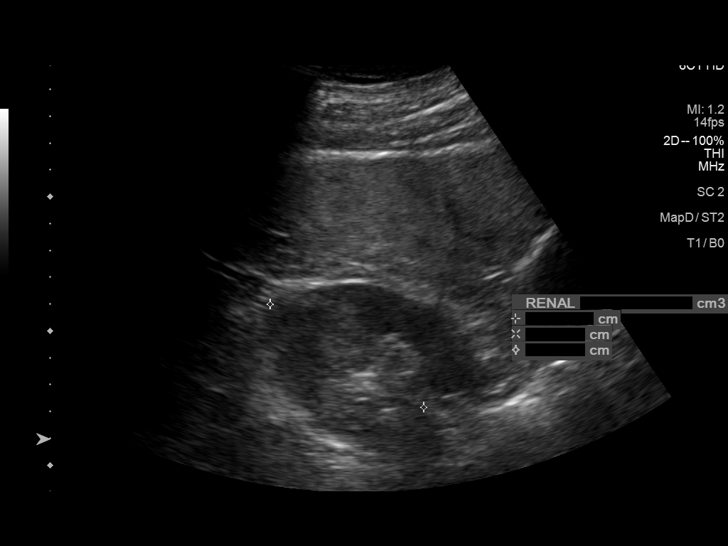
[im 6/36]
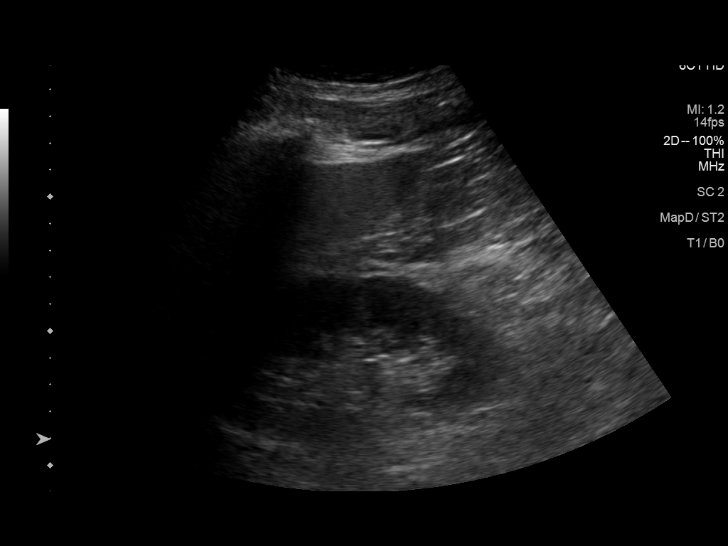
[im 9/36]
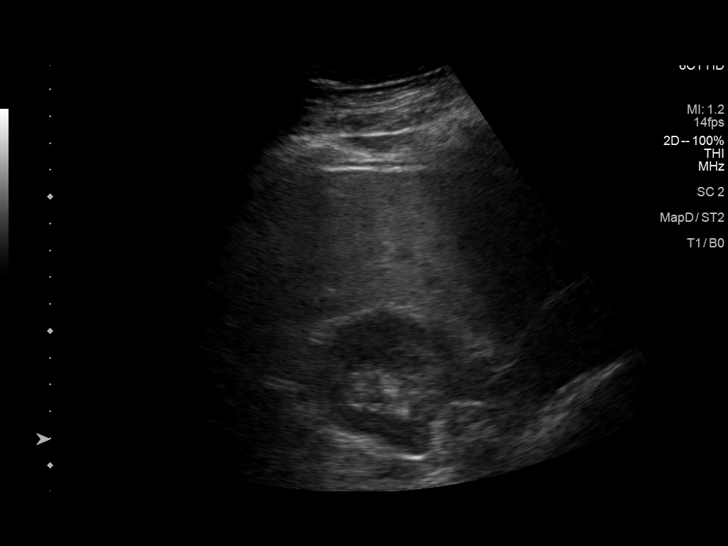
[im 12/36]
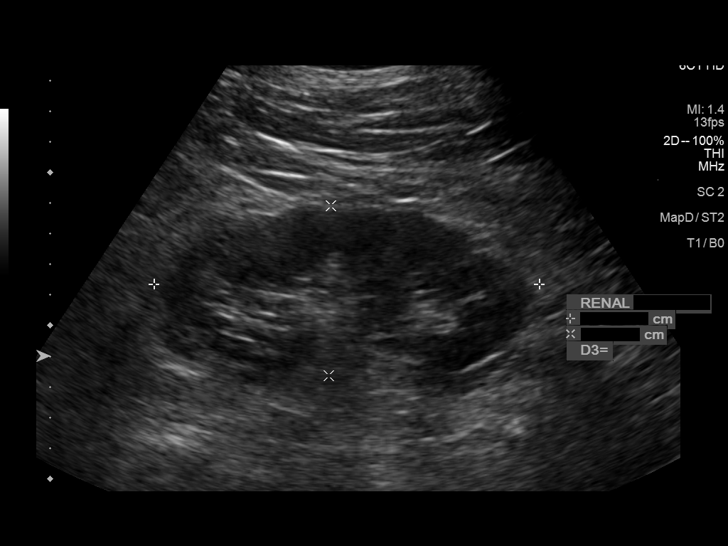
[im 14/36]
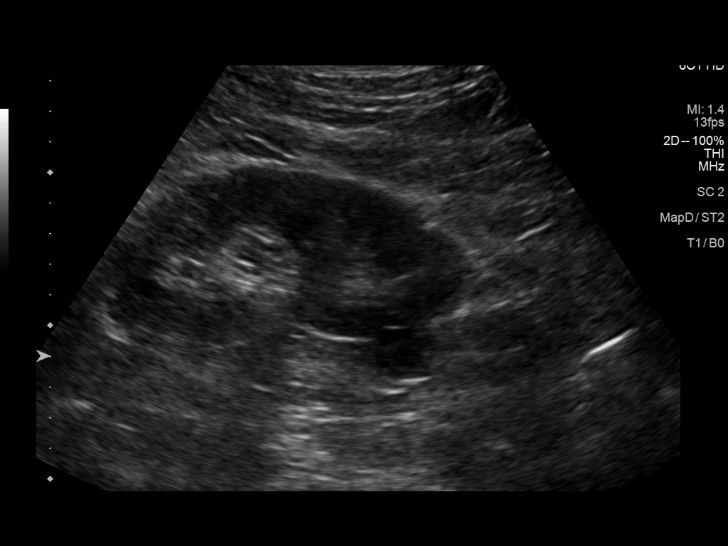
[im 17/36]
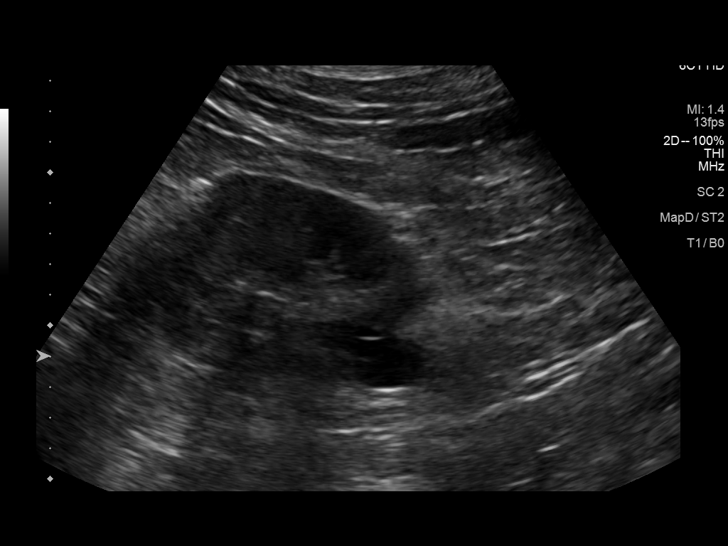
[im 19/36]
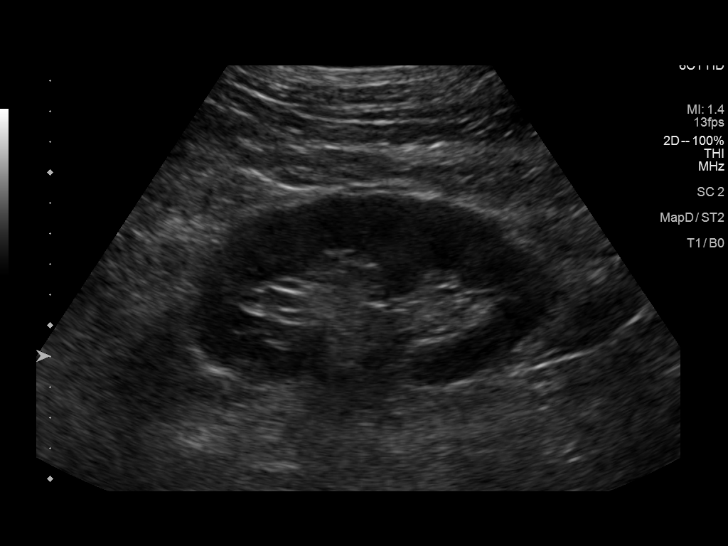
[im 22/36]
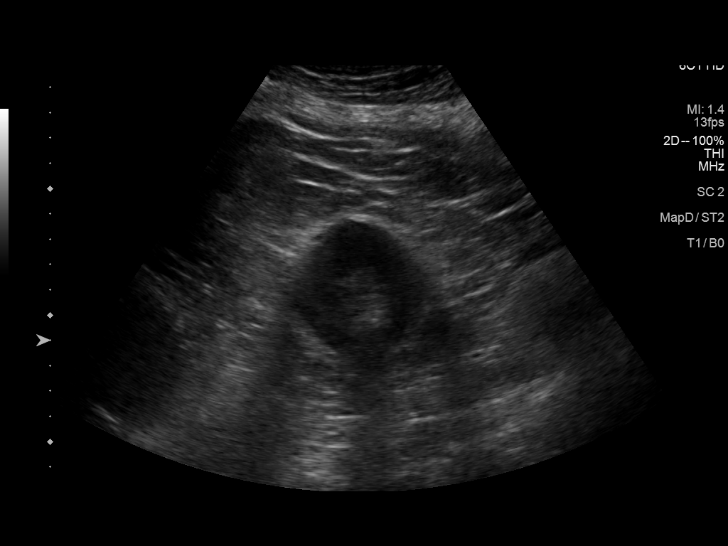
[im 24/36]
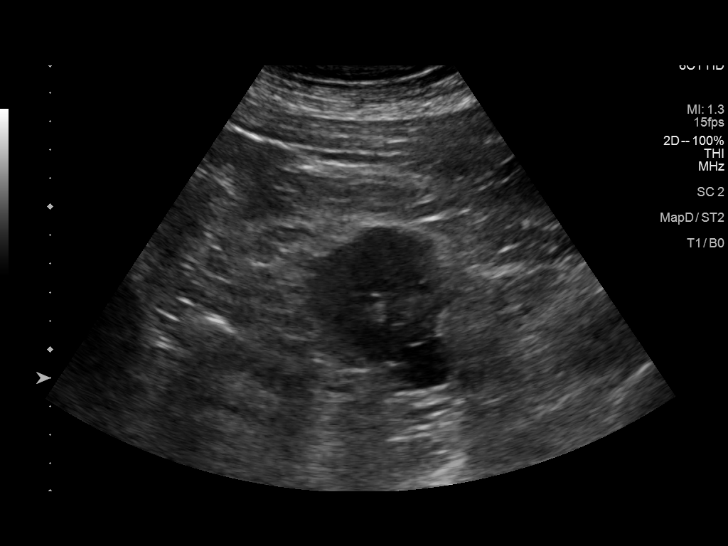
[im 27/36]
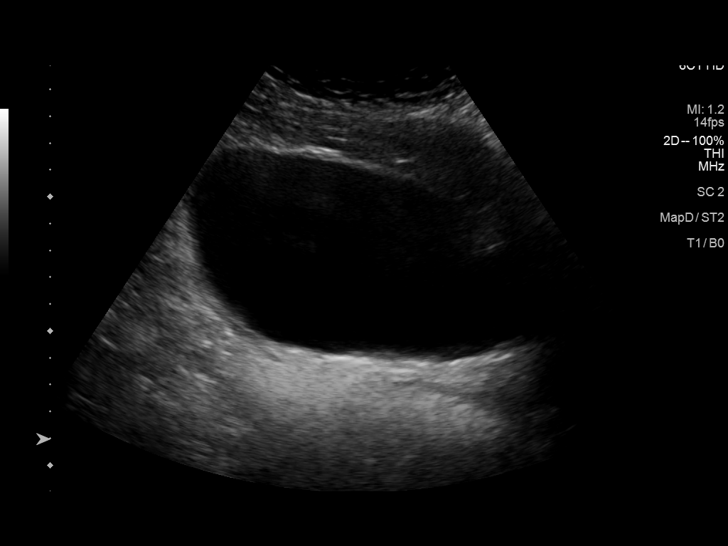
[im 30/36]
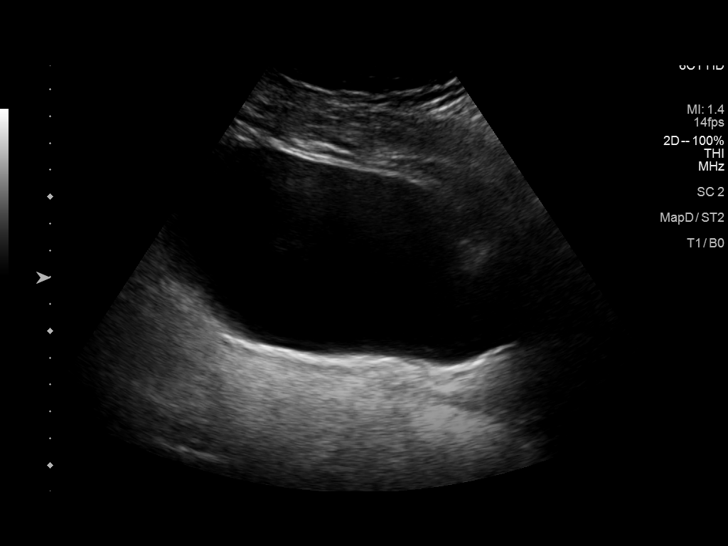
[im 33/36]
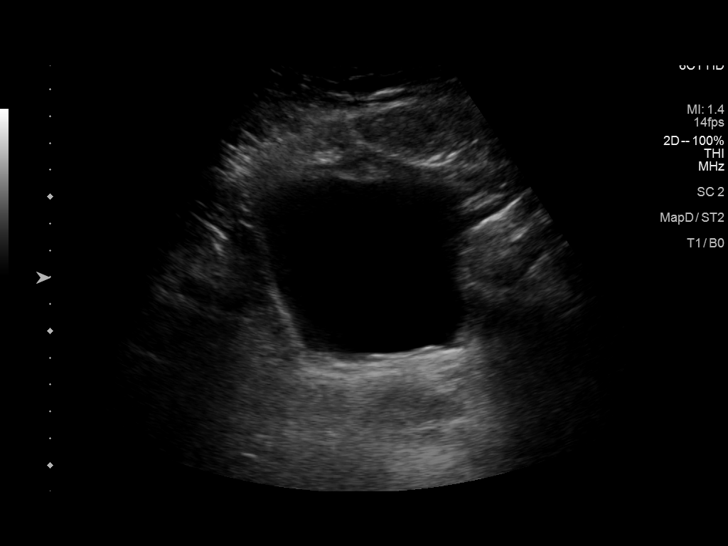
[im 36/36]
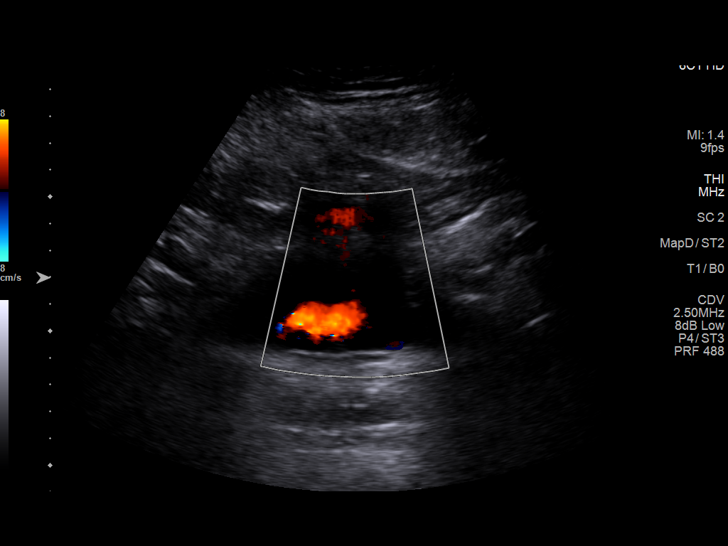

[14 of 25 positions shown; findings below may reference images not displayed]

FINDINGS: Right Kidney:

Renal measurements: 11.9 x 6.0 x 6.9 cm = volume: 258 mL. Normal
cortical thickness and echogenicity. No mass, hydronephrosis, or
shadowing calcification.

Left Kidney:

Renal measurements: 12.6 x 5.6 x 6.1 cm = volume: 221 mL. Normal
cortical thickness and echogenicity. No hydronephrosis or shadowing
calcification. Small simple exophytic cyst at inferior pole 2.6 x
2.0 x 2.2 cm corresponding to CT finding.

Bladder:

Appears normal for degree of bladder distention. BILATERAL ureteral
jets visualized.

Other:

RIGHT adrenal mass seen on prior CT exam is not demonstrated
sonographically.
IMPRESSION: Small exophytic simple appearing cyst at inferior pole LEFT kidney.

Remainder of exam unremarkable.

## 2022-06-10 DIAGNOSIS — E669 Obesity, unspecified: Secondary | ICD-10-CM | POA: Diagnosis not present

## 2022-06-13 DIAGNOSIS — Z9229 Personal history of other drug therapy: Secondary | ICD-10-CM

## 2022-06-13 DIAGNOSIS — F3340 Major depressive disorder, recurrent, in remission, unspecified: Secondary | ICD-10-CM | POA: Diagnosis not present

## 2022-06-13 DIAGNOSIS — E785 Hyperlipidemia, unspecified: Secondary | ICD-10-CM | POA: Diagnosis not present

## 2022-06-13 DIAGNOSIS — D500 Iron deficiency anemia secondary to blood loss (chronic): Secondary | ICD-10-CM | POA: Diagnosis not present

## 2022-06-13 DIAGNOSIS — I10 Essential (primary) hypertension: Secondary | ICD-10-CM | POA: Diagnosis not present

## 2022-06-13 HISTORY — DX: Personal history of other drug therapy: Z92.29

## 2022-07-29 DIAGNOSIS — M25562 Pain in left knee: Secondary | ICD-10-CM | POA: Diagnosis not present

## 2022-07-31 DIAGNOSIS — F332 Major depressive disorder, recurrent severe without psychotic features: Secondary | ICD-10-CM | POA: Diagnosis not present

## 2022-07-31 DIAGNOSIS — Z79899 Other long term (current) drug therapy: Secondary | ICD-10-CM | POA: Diagnosis not present

## 2022-08-22 DIAGNOSIS — G4733 Obstructive sleep apnea (adult) (pediatric): Secondary | ICD-10-CM | POA: Diagnosis not present

## 2022-08-29 DIAGNOSIS — G4733 Obstructive sleep apnea (adult) (pediatric): Secondary | ICD-10-CM | POA: Diagnosis not present

## 2022-09-01 DIAGNOSIS — E291 Testicular hypofunction: Secondary | ICD-10-CM | POA: Diagnosis not present

## 2022-09-30 DIAGNOSIS — E782 Mixed hyperlipidemia: Secondary | ICD-10-CM | POA: Diagnosis not present

## 2022-09-30 DIAGNOSIS — I10 Essential (primary) hypertension: Secondary | ICD-10-CM | POA: Diagnosis not present

## 2022-09-30 DIAGNOSIS — E669 Obesity, unspecified: Secondary | ICD-10-CM | POA: Diagnosis not present

## 2022-10-06 DIAGNOSIS — E782 Mixed hyperlipidemia: Secondary | ICD-10-CM | POA: Diagnosis not present

## 2022-10-06 DIAGNOSIS — E669 Obesity, unspecified: Secondary | ICD-10-CM | POA: Diagnosis not present

## 2022-10-06 DIAGNOSIS — I10 Essential (primary) hypertension: Secondary | ICD-10-CM | POA: Diagnosis not present

## 2022-10-30 DIAGNOSIS — Z23 Encounter for immunization: Secondary | ICD-10-CM | POA: Diagnosis not present

## 2022-11-06 DIAGNOSIS — F332 Major depressive disorder, recurrent severe without psychotic features: Secondary | ICD-10-CM | POA: Diagnosis not present

## 2022-11-06 DIAGNOSIS — Z79899 Other long term (current) drug therapy: Secondary | ICD-10-CM | POA: Diagnosis not present

## 2022-11-07 DIAGNOSIS — Z23 Encounter for immunization: Secondary | ICD-10-CM | POA: Diagnosis not present

## 2022-11-26 DIAGNOSIS — G4733 Obstructive sleep apnea (adult) (pediatric): Secondary | ICD-10-CM | POA: Diagnosis not present

## 2022-12-08 DIAGNOSIS — E291 Testicular hypofunction: Secondary | ICD-10-CM | POA: Diagnosis not present

## 2022-12-09 DIAGNOSIS — E785 Hyperlipidemia, unspecified: Secondary | ICD-10-CM | POA: Diagnosis not present

## 2022-12-09 DIAGNOSIS — Z9229 Personal history of other drug therapy: Secondary | ICD-10-CM | POA: Diagnosis not present

## 2022-12-09 DIAGNOSIS — Z125 Encounter for screening for malignant neoplasm of prostate: Secondary | ICD-10-CM | POA: Diagnosis not present

## 2022-12-26 DIAGNOSIS — J984 Other disorders of lung: Secondary | ICD-10-CM | POA: Diagnosis not present

## 2022-12-26 DIAGNOSIS — G4733 Obstructive sleep apnea (adult) (pediatric): Secondary | ICD-10-CM | POA: Diagnosis not present

## 2022-12-26 DIAGNOSIS — I10 Essential (primary) hypertension: Secondary | ICD-10-CM | POA: Diagnosis not present

## 2022-12-26 DIAGNOSIS — Z9989 Dependence on other enabling machines and devices: Secondary | ICD-10-CM | POA: Diagnosis not present

## 2023-01-12 ENCOUNTER — Encounter: Payer: Self-pay | Admitting: Family Medicine

## 2023-01-12 ENCOUNTER — Ambulatory Visit (INDEPENDENT_AMBULATORY_CARE_PROVIDER_SITE_OTHER): Payer: BC Managed Care – PPO | Admitting: Family Medicine

## 2023-01-12 VITALS — BP 122/80 | HR 77 | Ht 66.0 in | Wt 225.0 lb

## 2023-01-12 DIAGNOSIS — I10 Essential (primary) hypertension: Secondary | ICD-10-CM

## 2023-01-12 DIAGNOSIS — E538 Deficiency of other specified B group vitamins: Secondary | ICD-10-CM

## 2023-01-12 DIAGNOSIS — E278 Other specified disorders of adrenal gland: Secondary | ICD-10-CM

## 2023-01-12 DIAGNOSIS — G969 Disorder of central nervous system, unspecified: Secondary | ICD-10-CM

## 2023-01-12 DIAGNOSIS — R42 Dizziness and giddiness: Secondary | ICD-10-CM | POA: Insufficient documentation

## 2023-01-12 DIAGNOSIS — R251 Tremor, unspecified: Secondary | ICD-10-CM

## 2023-01-12 DIAGNOSIS — F3340 Major depressive disorder, recurrent, in remission, unspecified: Secondary | ICD-10-CM

## 2023-01-12 DIAGNOSIS — E531 Pyridoxine deficiency: Secondary | ICD-10-CM | POA: Diagnosis not present

## 2023-01-12 DIAGNOSIS — D509 Iron deficiency anemia, unspecified: Secondary | ICD-10-CM

## 2023-01-12 DIAGNOSIS — K439 Ventral hernia without obstruction or gangrene: Secondary | ICD-10-CM

## 2023-01-12 HISTORY — DX: Dizziness and giddiness: R42

## 2023-01-12 NOTE — Patient Instructions (Signed)
 I will refer you back to neurology for your tremor. We collected multiple labs today, and I will follow up results with you. Continue to see your psychiatrist. See your surgeon for your questions about your hernia. You are not due for a colonoscopy until 2030. Come back in 1 month for follow up.
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
## 2023-01-13 ENCOUNTER — Encounter: Payer: Self-pay | Admitting: Family Medicine

## 2023-01-13 DIAGNOSIS — E538 Deficiency of other specified B group vitamins: Secondary | ICD-10-CM | POA: Insufficient documentation

## 2023-01-13 NOTE — Assessment & Plan Note (Signed)
>>  ASSESSMENT AND PLAN FOR SEVERE EPISODE OF RECURRENT MAJOR DEPRESSIVE DISORDER, WITHOUT PSYCHOTIC FEATURES (HCC) WRITTEN ON 04/15/2023  3:35 PM BY Kutler Vanvranken, MD   >>ASSESSMENT AND PLAN FOR RECURRENT MAJOR DEPRESSIVE DISORDER, IN REMISSION (HCC) WRITTEN ON 01/13/2023  9:59 AM BY Evette Georges, MD  Follows with psychiatry.  Continue medications for now.  Advised to discuss medication effects on tremor at next appointment.

## 2023-01-13 NOTE — Assessment & Plan Note (Signed)
 Exam today notable for tremor with features that could be suggestive of essential tremor versus intention tremor.  Has previously seen neurology who completed workup to rule out Parkinson disease.  Given continued tremor, will refer to neurologist within the [facility] system for further evaluation.

## 2023-01-13 NOTE — Addendum Note (Signed)
 Addended by: Evette Georges B on: 01/13/2023 12:22 PM   Modules accepted: Orders

## 2023-01-13 NOTE — Assessment & Plan Note (Signed)
>>  ASSESSMENT AND PLAN FOR VENTRAL HERNIA WITHOUT OBSTRUCTION OR GANGRENE WRITTEN ON 01/13/2023  9:56 AM BY Maison Agrusa, MD  Stable.  Advised follow-up with prior surgeon for further recommendations, though discussed likely no intervention needed and less symptomatic.

## 2023-01-13 NOTE — Assessment & Plan Note (Signed)
 Follows with psychiatry.  Continue medications for now.  Advised to discuss medication effects on tremor at next appointment.

## 2023-01-13 NOTE — Assessment & Plan Note (Signed)
 Has been taking iron supplementation.  Will repeat iron studies today.

## 2023-01-13 NOTE — Assessment & Plan Note (Signed)
 Has been taking supplementation for this.  Will recheck levels today.

## 2023-01-13 NOTE — Assessment & Plan Note (Signed)
 Stable.  Advised follow-up with prior surgeon for further recommendations, though discussed likely no intervention needed and less symptomatic.

## 2023-01-13 NOTE — Assessment & Plan Note (Signed)
>>  ASSESSMENT AND PLAN FOR RECURRENT MAJOR DEPRESSIVE DISORDER, IN REMISSION (HCC) WRITTEN ON 01/13/2023  9:59 AM BY Evette Georges, MD  Follows with psychiatry.  Continue medications for now.  Advised to discuss medication effects on tremor at next appointment.

## 2023-01-13 NOTE — Assessment & Plan Note (Signed)
At goal. Continue benazepril.

## 2023-01-13 NOTE — Assessment & Plan Note (Signed)
 No concerning findings for pheochromocytoma at this time.  Follow-up at next visit and consider further evaluation as clinically warranted.

## 2023-01-14 ENCOUNTER — Encounter: Payer: Self-pay | Admitting: Family Medicine

## 2023-01-15 ENCOUNTER — Encounter: Payer: Self-pay | Admitting: Family Medicine

## 2023-01-15 LAB — VITAMIN B12: Vitamin B-12: >2000 pg/mL — ABNORMAL HIGH (ref 232–1245)

## 2023-01-15 LAB — IRON,TIBC AND FERRITIN PANEL
Ferritin: 191 ng/mL (ref 30–400)
Iron Saturation: 21 % (ref 15–55)
Iron: 88 ug/dL (ref 38–169)
Total Iron Binding Capacity: 421 ug/dL (ref 250–450)
UIBC: 333 ug/dL (ref 111–343)

## 2023-01-15 LAB — VITAMIN B6: Vitamin B6: 34.6 ug/L (ref 3.4–65.2)

## 2023-01-15 NOTE — Progress Notes (Deleted)
 Assessment/Plan:   Parkinsonism  -I had a long counseling session with the patient today.  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  I explained that one clinically cannot tell the difference between idiopathic parkinsons disease and secondary parkinsonism from medication.  I also explained that even if one is able to get off of the medication, it can take up to 6 months to clinically definitively know if this is idiopathic parkinsons disease.  I did not advise that the patient go off of medication, as this needs to be discussed with the patients prescribing physician.  I did, however, tell the patient that the longer one is on the medication, the worse the symptoms can get.  The patient is to make an appointment with *** to discuss what I have discussed with him.   -DaTscan on January 24, 2022 was normal. ***  Subjective:   Charles Costa was seen today in the movement disorders clinic for neurologic consultation at the request of Madelon Donald HERO, DO.  The consultation is for the evaluation of tremor.  Patient has previously been evaluated for the same by [facility].  Notes are reviewed.  Primary care notes are reviewed as well.  As previous, patient was seen 1 time at [facility].  This was only about 1 year ago, on January 17, 2022.  He was seen at the movement disorder clinic.  He was noted to have postural and kinetic tremor as well as bradykinesia on the right.  Because of both of these symptoms, he was referred for a DaTscan.  This was completed on January 24, 2022 and was normal.   Specific Symptoms:  Tremor: {yes no:314532} Family hx of similar:  {yes no:314532} Voice: *** Sleep: ***  Vivid Dreams:  {yes no:314532}  Acting out dreams:  {yes no:314532} Wet Pillows: {yes no:314532} Postural symptoms:  {yes  no:314532}  Falls?  {yes no:314532} Bradykinesia symptoms: {parkinson brady:18041} Loss of smell:  {yes no:314532} Loss of taste:  {yes no:314532} Urinary Incontinence:  {yes no:314532} Difficulty Swallowing:  {yes no:314532} Handwriting, micrographia: {yes no:314532} Trouble with ADL's:  {yes no:314532}  Trouble buttoning clothing: {yes no:314532} Depression:  {yes no:314532} Memory changes:  {yes no:314532} Hallucinations:  {yes no:314532}  visual distortions: {yes no:314532} N/V:  {yes no:314532} Lightheaded:  {yes no:314532}  Syncope: {yes no:314532} Diplopia:  {yes no:314532} Dyskinesia:  {yes no:314532} Prior exposure to reglan/antipsychotics: Yes.  ,  Currently Latuda but multiple prior to that including  Risperdal, Seroquel, Geodon  Neuroimaging of the brain has *** previously been performed.  It *** available for my review today.  Patient previously had MRI of the brain in April, 2022 with evidence of mild white matter disease.  PREVIOUS MEDICATIONS: {Parkinson's RX:18200}  ALLERGIES:   Allergies  Allergen Reactions   Dust Mite Extract Other (See Comments) and Itching    Sinusitis, stuffy nose  Sinusitis, stuffy nose  Sinusitis, stuffy nose  Sinusitis, stuffy nose  Sinusitis, stuffy nose  Sinusitis, stuffy nose   Poison Ivy Extract Other (See Comments)   Poison Oak Extract Other (See Comments)   Other     Saw dust; sinusitis    CURRENT MEDICATIONS:  No outpatient medications have been marked as taking for the 01/19/23 encounter (Appointment) with Leyah Bocchino, Asberry RAMAN, DO.     Objective:   VITALS:  There were no  vitals filed for this visit.  GEN:  The patient appears stated age and is in NAD. HEENT:  Normocephalic, atraumatic.  The mucous membranes are moist. The superficial temporal arteries are without ropiness or tenderness. CV:  RRR Lungs:  CTAB Neck/HEME:  There are no carotid bruits bilaterally.  Neurological examination:  Orientation: The patient is  alert and oriented x3.  Cranial nerves: There is good facial symmetry. Extraocular muscles are intact. The visual fields are full to confrontational testing. The speech is fluent and clear. Soft palate rises symmetrically and there is no tongue deviation. Hearing is intact to conversational tone. Sensation: Sensation is intact to light and pinprick throughout (facial, trunk, extremities). Vibration is intact at the bilateral big toe. There is no extinction with double simultaneous stimulation. There is no sensory dermatomal level identified. Motor: Strength is 5/5 in the bilateral upper and lower extremities.   Shoulder shrug is equal and symmetric.  There is no pronator drift. Deep tendon reflexes: Deep tendon reflexes are 2/4 at the bilateral biceps, triceps, brachioradialis, patella and achilles. Plantar responses are downgoing bilaterally.  Movement examination: Tone: There is ***tone in the bilateral upper extremities.  The tone in the lower extremities is ***.  Abnormal movements: *** Coordination:  There is *** decremation with RAM's, *** Gait and Station: The patient has *** difficulty arising out of a deep-seated chair without the use of the hands. The patient's stride length is ***.  The patient has a *** pull test.     I have reviewed and interpreted the following labs independently   Chemistry      Component Value Date/Time   NA 141 09/01/2019 1455   K 3.9 09/01/2019 1455   CL 103 09/01/2019 1455   CO2 22 09/01/2019 1455   BUN 11 09/01/2019 1455   CREATININE 0.78 09/01/2019 1455      Component Value Date/Time   CALCIUM  9.4 09/01/2019 1455   ALKPHOS 59 03/18/2017 1548   AST 16 03/18/2017 1548   ALT 15 03/18/2017 1548   BILITOT 0.4 03/18/2017 1548      Lab Results  Component Value Date   TSH 1.390 03/18/2017   Lab Results  Component Value Date   WBC 6.0 03/18/2017   HGB 14.3 03/18/2017   HCT 42.4 03/18/2017   MCV 96 03/18/2017   PLT 276 03/18/2017     Total time  spent on today's visit was ***60 minutes, including both face-to-face time and nonface-to-face time.  Time included that spent on review of records (prior notes available to me/labs/imaging if pertinent), discussing treatment and goals, answering patient's questions and coordinating care.  Cc:  Tharon Lung, MD

## 2023-01-19 ENCOUNTER — Ambulatory Visit: Payer: BC Managed Care – PPO | Admitting: Neurology

## 2023-01-30 NOTE — Progress Notes (Unsigned)
Assessment/Plan:   Parkinsonism  -I had a long counseling session with the patient today.  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  I explained that one clinically cannot tell the difference between idiopathic parkinsons disease and secondary parkinsonism from medication.  I also explained that even if one is able to get off of the medication, it can take up to 6 months to clinically definitively know if this is idiopathic parkinsons disease.  I did not advise that the patient go off of medication, as this needs to be discussed with the patients prescribing physician.  I did, however, tell the patient that the longer one is on the medication, the worse the symptoms can get.  The patient is to make an appointment with *** to discuss what I have discussed with him.   -DaTscan on January 24, 2022 was normal. ***  Subjective:   Charles Costa was seen today in the movement disorders clinic for neurologic consultation at the request of Evette Georges, MD.  The consultation is for the evaluation of tremor.  Patient has previously been evaluated for the same by [facility].  Notes are reviewed.  Primary care notes are reviewed as well.  As previous, patient was seen 1 time at [facility].  This was only about 1 year ago, on January 17, 2022.  He was seen at the movement disorder clinic.  He was noted to have postural and kinetic tremor as well as bradykinesia on the right.  Because of both of these symptoms, he was referred for a DaTscan.  This was completed on January 24, 2022 and was normal.   Specific Symptoms:  Tremor: {yes no:314532} Family hx of similar:  {yes no:314532} Voice: *** Sleep: ***  Vivid Dreams:  {yes no:314532}  Acting out dreams:  {yes no:314532} Wet Pillows: {yes no:314532} Postural symptoms:  {yes  no:314532}  Falls?  {yes no:314532} Bradykinesia symptoms: {parkinson brady:18041} Loss of smell:  {yes no:314532} Loss of taste:  {yes no:314532} Urinary Incontinence:  {yes no:314532} Difficulty Swallowing:  {yes no:314532} Handwriting, micrographia: {yes no:314532} Trouble with ADL's:  {yes no:314532}  Trouble buttoning clothing: {yes no:314532} Depression:  {yes no:314532} Memory changes:  {yes no:314532} Hallucinations:  {yes no:314532}  visual distortions: {yes no:314532} N/V:  {yes no:314532} Lightheaded:  {yes no:314532}  Syncope: {yes no:314532} Diplopia:  {yes no:314532} Dyskinesia:  {yes no:314532} Prior exposure to reglan/antipsychotics: Yes.  ,  Currently Latuda but multiple prior to that including  Risperdal, Seroquel, Geodon  Neuroimaging of the brain has *** previously been performed.  It *** available for my review today.  Patient previously had MRI of the brain in April, 2022 with evidence of mild white matter disease.  PREVIOUS MEDICATIONS: {Parkinson's RX:18200}  ALLERGIES:   Allergies  Allergen Reactions   Dust Mite Extract Other (See Comments) and Itching    Sinusitis, stuffy nose  Sinusitis, stuffy nose  Sinusitis, stuffy nose  Sinusitis, stuffy nose  Sinusitis, stuffy nose  Sinusitis, stuffy nose   Poison Ivy Extract Other (See Comments)   Poison Oak Extract Other (See Comments)   Other     Saw dust; sinusitis    CURRENT MEDICATIONS:  No outpatient medications have been marked as taking for the 02/03/23 encounter (Appointment) with Lanisha Stepanian, Octaviano Batty, DO.     Objective:   VITALS:  There were no vitals  filed for this visit.  GEN:  The patient appears stated age and is in NAD. HEENT:  Normocephalic, atraumatic.  The mucous membranes are moist. The superficial temporal arteries are without ropiness or tenderness. CV:  RRR Lungs:  CTAB Neck/HEME:  There are no carotid bruits bilaterally.  Neurological examination:  Orientation: The patient is  alert and oriented x3.  Cranial nerves: There is good facial symmetry. Extraocular muscles are intact. The visual fields are full to confrontational testing. The speech is fluent and clear. Soft palate rises symmetrically and there is no tongue deviation. Hearing is intact to conversational tone. Sensation: Sensation is intact to light and pinprick throughout (facial, trunk, extremities). Vibration is intact at the bilateral big toe. There is no extinction with double simultaneous stimulation. There is no sensory dermatomal level identified. Motor: Strength is 5/5 in the bilateral upper and lower extremities.   Shoulder shrug is equal and symmetric.  There is no pronator drift. Deep tendon reflexes: Deep tendon reflexes are 2/4 at the bilateral biceps, triceps, brachioradialis, patella and achilles. Plantar responses are downgoing bilaterally.  Movement examination: Tone: There is ***tone in the bilateral upper extremities.  The tone in the lower extremities is ***.  Abnormal movements: *** Coordination:  There is *** decremation with RAM's, *** Gait and Station: The patient has *** difficulty arising out of a deep-seated chair without the use of the hands. The patient's stride length is ***.  The patient has a *** pull test.     I have reviewed and interpreted the following labs independently   Chemistry      Component Value Date/Time   NA 141 09/01/2019 1455   K 3.9 09/01/2019 1455   CL 103 09/01/2019 1455   CO2 22 09/01/2019 1455   BUN 11 09/01/2019 1455   CREATININE 0.78 09/01/2019 1455      Component Value Date/Time   CALCIUM 9.4 09/01/2019 1455   ALKPHOS 59 03/18/2017 1548   AST 16 03/18/2017 1548   ALT 15 03/18/2017 1548   BILITOT 0.4 03/18/2017 1548      Lab Results  Component Value Date   TSH 1.390 03/18/2017   Lab Results  Component Value Date   WBC 6.0 03/18/2017   HGB 14.3 03/18/2017   HCT 42.4 03/18/2017   MCV 96 03/18/2017   PLT 276 03/18/2017     Total time  spent on today's visit was ***60 minutes, including both face-to-face time and nonface-to-face time.  Time included that spent on review of records (prior notes available to me/labs/imaging if pertinent), discussing treatment and goals, answering patient's questions and coordinating care.  Cc:  Evette Georges, MD

## 2023-02-03 ENCOUNTER — Ambulatory Visit (INDEPENDENT_AMBULATORY_CARE_PROVIDER_SITE_OTHER): Payer: BC Managed Care – PPO | Admitting: Neurology

## 2023-02-03 ENCOUNTER — Encounter: Payer: Self-pay | Admitting: Neurology

## 2023-02-03 VITALS — BP 113/73 | HR 75 | Ht 66.0 in | Wt 220.0 lb

## 2023-02-03 DIAGNOSIS — G2401 Drug induced subacute dyskinesia: Secondary | ICD-10-CM | POA: Diagnosis not present

## 2023-02-03 NOTE — Patient Instructions (Signed)
We discussed that you have symptoms of tardive dyskinesia.  These symptoms are mild and likely from the medications you have been exposed to in the past.  You should ask your psychiatrist if medication like austedo/ingrezza are appropriate for you.  These medications are meant to be used in combination with psychiatric medications, such as Latuda.  The physicians and staff at [facility] are committed to providing excellent care. You may receive a survey requesting feedback about your experience at our office. We strive to receive "very good" responses to the survey questions. If you feel that your experience would prevent you from giving the office a "very good " response, please contact our office to try to remedy the situation. We may be reached at [phone]. Thank you for taking the time out of your busy day to complete the survey.

## 2023-02-04 ENCOUNTER — Encounter: Payer: Self-pay | Admitting: Neurology

## 2023-02-09 DIAGNOSIS — K219 Gastro-esophageal reflux disease without esophagitis: Secondary | ICD-10-CM | POA: Diagnosis not present

## 2023-02-09 DIAGNOSIS — E66812 Obesity, class 2: Secondary | ICD-10-CM | POA: Diagnosis not present

## 2023-02-09 DIAGNOSIS — I10 Essential (primary) hypertension: Secondary | ICD-10-CM | POA: Diagnosis not present

## 2023-02-09 DIAGNOSIS — Z6836 Body mass index (BMI) 36.0-36.9, adult: Secondary | ICD-10-CM | POA: Diagnosis not present

## 2023-02-10 ENCOUNTER — Encounter: Payer: Self-pay | Admitting: Family Medicine

## 2023-02-10 ENCOUNTER — Ambulatory Visit (INDEPENDENT_AMBULATORY_CARE_PROVIDER_SITE_OTHER): Payer: BC Managed Care – PPO | Admitting: Family Medicine

## 2023-02-10 ENCOUNTER — Other Ambulatory Visit (HOSPITAL_COMMUNITY): Payer: Self-pay

## 2023-02-10 VITALS — BP 155/98 | HR 74 | Ht 66.0 in | Wt 230.2 lb

## 2023-02-10 DIAGNOSIS — G4730 Sleep apnea, unspecified: Secondary | ICD-10-CM | POA: Diagnosis not present

## 2023-02-10 DIAGNOSIS — G2401 Drug induced subacute dyskinesia: Secondary | ICD-10-CM | POA: Insufficient documentation

## 2023-02-10 DIAGNOSIS — I10 Essential (primary) hypertension: Secondary | ICD-10-CM

## 2023-02-10 DIAGNOSIS — E230 Hypopituitarism: Secondary | ICD-10-CM

## 2023-02-10 DIAGNOSIS — Z59819 Housing instability, housed unspecified: Secondary | ICD-10-CM | POA: Diagnosis not present

## 2023-02-10 DIAGNOSIS — E278 Other specified disorders of adrenal gland: Secondary | ICD-10-CM

## 2023-02-10 DIAGNOSIS — F3340 Major depressive disorder, recurrent, in remission, unspecified: Secondary | ICD-10-CM

## 2023-02-10 DIAGNOSIS — F339 Major depressive disorder, recurrent, unspecified: Secondary | ICD-10-CM

## 2023-02-10 DIAGNOSIS — R7303 Prediabetes: Secondary | ICD-10-CM | POA: Diagnosis not present

## 2023-02-10 DIAGNOSIS — E785 Hyperlipidemia, unspecified: Secondary | ICD-10-CM

## 2023-02-10 DIAGNOSIS — N281 Cyst of kidney, acquired: Secondary | ICD-10-CM

## 2023-02-10 LAB — POCT GLYCOSYLATED HEMOGLOBIN (HGB A1C): Hemoglobin A1C: 5.6 % (ref 4.0–5.6)

## 2023-02-10 MED ORDER — BENAZEPRIL HCL 40 MG PO TABS
40.0000 mg | ORAL_TABLET | Freq: Every day | ORAL | 3 refills | Status: DC
  Filled 2023-02-10 – 2023-03-24 (×2): qty 90, 90d supply, fill #0
  Filled 2023-06-21: qty 90, 90d supply, fill #1
  Filled 2023-09-20: qty 90, 90d supply, fill #2
  Filled 2023-12-15: qty 90, 90d supply, fill #3

## 2023-02-10 NOTE — Assessment & Plan Note (Signed)
Blood pressure elevated today in the setting of acute stressors, especially given normal BP at previous office visits.  Needs a refill of benazepril; will refill today.  Recheck at next visit.

## 2023-02-10 NOTE — Assessment & Plan Note (Signed)
As discussed and confirmed with neurology.  He will speak to psychiatry about starting Austedo to help with this.

## 2023-02-10 NOTE — Assessment & Plan Note (Signed)
 Continue CPAP.

## 2023-02-10 NOTE — Patient Instructions (Addendum)
 I will refill your benazepril  and send in a referral to care management. We will also order imaging of your kidneys. They will call you about this appointment. I have given you an advanced directive packet. Please fill this out and bring it back in 3 months. Your blood sugar levels are normal today. Continue to see your psychiatrist. We will request your records.

## 2023-02-10 NOTE — Progress Notes (Signed)
    SUBJECTIVE:   CHIEF COMPLAINT / HPI:   MDD, tardive dyskinesia Continues to see psychiatrist Dr. Bard Grater MD PhD regularly, his next appointment is this Thursday.  He has talked about going on Latuda with them. Saw neurology recently who agreed he demonstrated mild TD, and he plans to speak with his psychiatrist about addition of Austedo.  PHQ-9 is elevated as below, though he does not have SI/HI today.   Severely elevated Vitamin B12 Recommended to stop use at last visit. He no longer takes this.  Adrenal incidentaloma, renal cyst Continues to be worrisome for him.  Reassuringly without hypertension until today, though he has a lot of acute stressors going on recently as below.  Would like further evaluation.  OSA States he has been diagnosed in the past and uses CPAP at home without incident.  PERTINENT  PMH / PSH:  -Mom recently diagnosed with uterine cancer -Heater went out recently and had to go to hotel to stay -Recounts when he worked at [employer] in [phone] and had multiple lung infections -Has a history of being on depakote and neurontin to help neck pains while on risperdal, so he does not want to take this again  OBJECTIVE:   BP (!) 155/98   Pulse 74   Ht 5' 6 (1.676 m)   Wt 230 lb 3.2 oz (104.4 kg)   SpO2 96%   BMI 37.16 kg/m   General: Alert and oriented, in NAD Skin: Warm, dry, and intact without lesions HEENT: NCAT, EOM grossly normal, midline nasal septum Cardiac: RRR, no m/r/g appreciated Respiratory: CTAB, breathing and speaking comfortably on RA Abdominal: Soft, nontender, nondistended, normoactive bowel sounds Extremities: Moves all extremities grossly equally Neurological: No gross focal deficit Psychiatric: Appropriate mood and affect, PHQ-9 = 20 with negative #9  ASSESSMENT/PLAN:   Primary hypertension Blood pressure elevated today in the setting of acute stressors, especially given normal BP at previous office visits.  Needs a refill  of benazepril ; will refill today.  Recheck at next visit.  Sleep apnea Continue CPAP.  Hypogonadotropic hypogonadism (HCC) Not currently on testosterone .  Last follow-up with endocrinology 2023.  Adrenal incidentaloma (HCC) Reassuringly without consistent signs of pheochromocytoma.  Patient does have elevated BMI, however, and does not have recent labs checking glucose and electrolyte function.  A1c reassuringly normal as below.  Will send MyChart message to patient to assess desire for further workup of adrenal adenoma given repeat imaging unlikely to have much clinical utility at this time.  Cyst of left kidney No formal recommendations on previous CT abdomen report to follow-up cystic lesion of left kidney.  Can consider CT abdomen for further characterization if repeat imaging for adrenal incidentaloma in the future.  Tardive dyskinesia As discussed and confirmed with neurology.  He will speak to psychiatry about starting Austedo to help with this.  MDD (major depressive disorder) PHQ-9 elevated though reassuringly without SI/HI.  He has close follow-up with his psychiatrist later this week.  Will send release of information to his psychiatrist to obtain more information.  Hyperlipidemia If patient would like to pursue blood work for adrenal incidentaloma, can consider adding on lipid panel for repeat.   Health maintenance A1c updated today and normal at 5.6.  Medication list updated today.  Given advanced directive packet to fill out.  Care manager consulted given recent caregiver stress and housing/utility difficulties.  Stuart Redo, MD [facility]

## 2023-02-10 NOTE — Assessment & Plan Note (Signed)
No formal recommendations on previous CT abdomen report to follow-up cystic lesion of left kidney.  Can consider CT abdomen for further characterization if repeat imaging for adrenal incidentaloma in the future.

## 2023-02-10 NOTE — Assessment & Plan Note (Signed)
Not currently on testosterone.  Last follow-up with endocrinology 2023.

## 2023-02-10 NOTE — Assessment & Plan Note (Signed)
 Reassuringly without consistent signs of pheochromocytoma.  Patient does have elevated BMI, however, and does not have recent labs checking glucose and electrolyte function.  A1c reassuringly normal as below.  Will send MyChart message to patient to assess desire for further workup of adrenal adenoma given repeat imaging unlikely to have much clinical utility at this time.

## 2023-02-10 NOTE — Assessment & Plan Note (Signed)
If patient would like to pursue blood work for adrenal incidentaloma, can consider adding on lipid panel for repeat.

## 2023-02-10 NOTE — Assessment & Plan Note (Addendum)
PHQ-9 elevated though reassuringly without SI/HI.  He has close follow-up with his psychiatrist later this week.  Will send release of information to his psychiatrist to obtain more information.

## 2023-02-11 ENCOUNTER — Encounter: Payer: Self-pay | Admitting: Family Medicine

## 2023-02-11 ENCOUNTER — Telehealth: Payer: Self-pay | Admitting: *Deleted

## 2023-02-11 NOTE — Progress Notes (Signed)
 Complex Care Management Note Care Guide Note  02/11/2023 Name: Charles Costa MRN: [id number] DOB: 02-27-1965   Complex Care Management Outreach Attempts: An unsuccessful telephone outreach was attempted today to offer the patient information about available complex care management services.  Follow Up Plan:  Additional outreach attempts will be made to offer the patient complex care management information and services.   Encounter Outcome:  No Answer  Harlene Satterfield  [facility]  [facility] Guide  Direct Dial: [phone]  Fax [phone]

## 2023-02-11 NOTE — Progress Notes (Signed)
 Complex Care Management Note  Care Guide Note 02/11/2023 Name: Khaliq Turay MRN: [id number] DOB: 01-Jul-1965  Gustaf Mccarter is a 58 y.o. year old male who sees Tharon Lung, MD for primary care. I reached out to Johnathyn Viscomi by phone today to offer complex care management services.  Mr. Schlink was given information about Complex Care Management services today including:   The Complex Care Management services include support from the care team which includes your Nurse Care Manager, Clinical Social Worker, or Pharmacist.  The Complex Care Management team is here to help remove barriers to the health concerns and goals most important to you. Complex Care Management services are voluntary, and the patient may decline or stop services at any time by request to their care team member.   Complex Care Management Consent Status: Patient did not agree to participate in complex care management services at this time.   Encounter Outcome:  Patient Refused  Harlene Satterfield  [facility]  [facility], [facility] Guide  Direct Dial: [phone]  Fax [phone]

## 2023-02-12 ENCOUNTER — Other Ambulatory Visit (HOSPITAL_COMMUNITY): Payer: Self-pay

## 2023-02-12 ENCOUNTER — Other Ambulatory Visit: Payer: Self-pay

## 2023-02-12 DIAGNOSIS — F419 Anxiety disorder, unspecified: Secondary | ICD-10-CM | POA: Diagnosis not present

## 2023-02-12 DIAGNOSIS — F331 Major depressive disorder, recurrent, moderate: Secondary | ICD-10-CM | POA: Diagnosis not present

## 2023-02-12 DIAGNOSIS — Z79899 Other long term (current) drug therapy: Secondary | ICD-10-CM | POA: Diagnosis not present

## 2023-02-16 ENCOUNTER — Other Ambulatory Visit (HOSPITAL_COMMUNITY): Payer: Self-pay

## 2023-02-16 ENCOUNTER — Other Ambulatory Visit: Payer: BC Managed Care – PPO

## 2023-02-16 DIAGNOSIS — F3340 Major depressive disorder, recurrent, in remission, unspecified: Secondary | ICD-10-CM

## 2023-02-16 DIAGNOSIS — E278 Other specified disorders of adrenal gland: Secondary | ICD-10-CM | POA: Diagnosis not present

## 2023-02-16 DIAGNOSIS — I10 Essential (primary) hypertension: Secondary | ICD-10-CM | POA: Diagnosis not present

## 2023-02-17 ENCOUNTER — Encounter: Payer: Self-pay | Admitting: Family Medicine

## 2023-02-24 ENCOUNTER — Encounter: Payer: Self-pay | Admitting: Family Medicine

## 2023-02-24 LAB — CATECHOLAMINES, FRACTIONATED, PLASMA
Dopamine: <30 pg/mL (ref 0–48)
Epinephrine: 24 pg/mL (ref 0–62)
Norepinephrine: 199 pg/mL (ref 0–874)

## 2023-02-24 LAB — BASIC METABOLIC PANEL
BUN/Creatinine Ratio: 19 (ref 9–20)
BUN: 13 mg/dL (ref 6–24)
CO2: 22 mmol/L (ref 20–29)
Calcium: 9.1 mg/dL (ref 8.7–10.2)
Chloride: 100 mmol/L (ref 96–106)
Creatinine, Ser: 0.67 mg/dL — ABNORMAL LOW (ref 0.76–1.27)
Glucose: 95 mg/dL (ref 70–99)
Potassium: 4.3 mmol/L (ref 3.5–5.2)
Sodium: 137 mmol/L (ref 134–144)
eGFR: 110 mL/min/{1.73_m2} (ref >59)

## 2023-02-24 LAB — ALDOSTERONE + RENIN ACTIVITY W/ RATIO
Aldos/Renin Ratio: 0.9 (ref 0.0–30.0)
Aldosterone: 3.6 ng/dL (ref 0.0–30.0)
Renin Activity, Plasma: 3.888 ng/mL/h (ref 0.167–5.380)

## 2023-02-24 LAB — LIPID PANEL
Chol/HDL Ratio: 3 {ratio} (ref 0.0–5.0)
Cholesterol, Total: 186 mg/dL (ref 100–199)
HDL: 62 mg/dL (ref >39)
LDL Chol Calc (NIH): 99 mg/dL (ref 0–99)
Triglycerides: 146 mg/dL (ref 0–149)
VLDL Cholesterol Cal: 25 mg/dL (ref 5–40)

## 2023-02-24 LAB — DHEA-SULFATE: DHEA-SO4: 66.7 ug/dL (ref 48.9–344.2)

## 2023-02-24 LAB — TSH RFX ON ABNORMAL TO FREE T4: TSH: 1.71 u[IU]/mL (ref 0.450–4.500)

## 2023-02-24 LAB — MAGNESIUM: Magnesium: 2 mg/dL (ref 1.6–2.3)

## 2023-03-04 ENCOUNTER — Encounter: Payer: Self-pay | Admitting: Family Medicine

## 2023-03-04 ENCOUNTER — Encounter: Payer: Self-pay | Admitting: Neurology

## 2023-03-24 ENCOUNTER — Other Ambulatory Visit (HOSPITAL_COMMUNITY): Payer: Self-pay

## 2023-04-14 ENCOUNTER — Ambulatory Visit (INDEPENDENT_AMBULATORY_CARE_PROVIDER_SITE_OTHER): Payer: BC Managed Care – PPO | Admitting: Family Medicine

## 2023-04-14 ENCOUNTER — Encounter: Payer: Self-pay | Admitting: Family Medicine

## 2023-04-14 VITALS — BP 157/99 | HR 66 | Ht 66.0 in | Wt 225.5 lb

## 2023-04-14 DIAGNOSIS — Z9889 Other specified postprocedural states: Secondary | ICD-10-CM

## 2023-04-14 DIAGNOSIS — D509 Iron deficiency anemia, unspecified: Secondary | ICD-10-CM | POA: Diagnosis not present

## 2023-04-14 DIAGNOSIS — E230 Hypopituitarism: Secondary | ICD-10-CM

## 2023-04-14 DIAGNOSIS — E278 Other specified disorders of adrenal gland: Secondary | ICD-10-CM

## 2023-04-14 DIAGNOSIS — F339 Major depressive disorder, recurrent, unspecified: Secondary | ICD-10-CM

## 2023-04-14 DIAGNOSIS — Z8719 Personal history of other diseases of the digestive system: Secondary | ICD-10-CM

## 2023-04-14 DIAGNOSIS — G2401 Drug induced subacute dyskinesia: Secondary | ICD-10-CM | POA: Diagnosis not present

## 2023-04-14 DIAGNOSIS — T1491XA Suicide attempt, initial encounter: Secondary | ICD-10-CM

## 2023-04-14 NOTE — Progress Notes (Unsigned)
    SUBJECTIVE:   CHIEF COMPLAINT / HPI:   MDD, tardive dyskinesia Continues to follow with psychiatrist.  Recently discussed starting Ingrezza with his psychiatrist though had stiffness of the legs and acute dystonia of the back of the head and neck, so he is no longer taking this.  Adrenal incidentaloma As seen on prior CT abdomen pelvis.  Labs collected at last visit including DHEA, fractionated plasma catecholamines, and Aldo/renin activity all normal.  Discussed previously with Dr. Linwood Dibbles that if he test were negative, could refer to endocrinology for dexamethasone suppression testing and 24-hour urine testing to rule out pheochromocytoma given his hypertension. He states he has had this testing done at endocrinology in the past with normal results.  Hypogonadotrophic hypogonadism Has restarted the testosterone over the last month but feels he would like to stop this again. It does make him feel more decisive and relaxed, though he feels it is acting like a mild dopamine agonist now, and it helps his TMJ pain. Has not followed up with endocrinology since 2023.  Iron deficiency Has been taking iron pills, but he has stopped these due to stomach upset and BP elevations. He would like recheck today.  PERTINENT  PMH / PSH: Hypertension, hiatal hernia, GERD, osteoarthritis  OBJECTIVE:   BP (!) 157/99   Pulse 66   Ht 5\' 6"  (1.676 m)   Wt 225 lb 8 oz (102.3 kg)   SpO2 96%   BMI 36.40 kg/m   General: Alert and oriented, in NAD Skin: Warm, dry, and intact HEENT: NCAT, EOM grossly normal, midline nasal septum Cardiac: RRR, no m/r/g appreciated Respiratory: CTAB, breathing and speaking comfortably on RA Extremities: Moves all extremities grossly equally Neurological: No gross focal deficit Psychiatric: Baseline mood and affect, PHQ-9 = 17 with negative #9  ASSESSMENT/PLAN:   Assessment & Plan Adrenal incidentaloma (HCC) Previously negative labs.  He had previously negative workup  at endocrinology a couple years back.  Continues to be hypertensive.  Referring to endocrinology for monitoring/follow-up as below. Iron deficiency anemia, unspecified iron deficiency anemia type Iron pills have been upsetting his stomach.  He would like to get his labs rechecked today in hopes of getting off the medication.  Will recheck iron studies and CBC. Tardive dyskinesia In the setting of medication use.  Briefly tried Ingrezza with side effects precluding further use.  Continue to follow with psychiatry for further options. Episode of recurrent major depressive disorder, unspecified depression episode severity (HCC) PHQ-9 elevated today though reassuringly with negative #9.  He follows closely with his psychiatrist. Hypogonadotropic hypogonadism (HCC) Would like to stop testosterone supplementation.  Discussed following up with endocrinology for further evaluation/monitoring as he tapers off.  Also collect CBC to monitor hematocrit today.   Janeal Holmes, MD [facility]

## 2023-04-14 NOTE — Patient Instructions (Signed)
 I am glad you are off the ingrezza given those side effects.  Let me know if you would like for me to send in another referral to a endocrinologist here at [facility], and I can get that for you.  We obtained labs today. I will keep you updated.  I will see you in 3 months or sooner if needed!

## 2023-04-15 ENCOUNTER — Encounter: Payer: Self-pay | Admitting: Family Medicine

## 2023-04-15 LAB — IRON,TIBC AND FERRITIN PANEL
Ferritin: 419 ng/mL — ABNORMAL HIGH (ref 30–400)
Iron Saturation: 27 % (ref 15–55)
Iron: 108 ug/dL (ref 38–169)
Total Iron Binding Capacity: 401 ug/dL (ref 250–450)
UIBC: 293 ug/dL (ref 111–343)

## 2023-04-15 LAB — CBC
Hematocrit: 45.4 % (ref 37.5–51.0)
Hemoglobin: 15.4 g/dL (ref 13.0–17.7)
MCH: 33.4 pg — ABNORMAL HIGH (ref 26.6–33.0)
MCHC: 33.9 g/dL (ref 31.5–35.7)
MCV: 99 fL — ABNORMAL HIGH (ref 79–97)
Platelets: 259 10*3/uL (ref 150–450)
RBC: 4.61 x10E6/uL (ref 4.14–5.80)
RDW: 14 % (ref 11.6–15.4)
WBC: 5.4 10*3/uL (ref 3.4–10.8)

## 2023-04-15 NOTE — Assessment & Plan Note (Signed)
 Iron pills have been upsetting his stomach.  He would like to get his labs rechecked today in hopes of getting off the medication.  Will recheck iron studies and CBC.

## 2023-04-15 NOTE — Assessment & Plan Note (Signed)
 Previously negative labs.  He had previously negative workup at endocrinology a couple years back.  Continues to be hypertensive.  Referring to endocrinology for monitoring/follow-up as below.

## 2023-04-15 NOTE — Assessment & Plan Note (Signed)
 In the setting of medication use.  Briefly tried Ingrezza with side effects precluding further use.  Continue to follow with psychiatry for further options.

## 2023-04-15 NOTE — Assessment & Plan Note (Signed)
 PHQ-9 elevated today though reassuringly with negative #9.  He follows closely with his psychiatrist.

## 2023-04-15 NOTE — Assessment & Plan Note (Signed)
 Would like to stop testosterone supplementation.  Discussed following up with endocrinology for further evaluation/monitoring as he tapers off.  Also collect CBC to monitor hematocrit today.

## 2023-05-11 DIAGNOSIS — E291 Testicular hypofunction: Secondary | ICD-10-CM | POA: Diagnosis not present

## 2023-05-11 DIAGNOSIS — G4733 Obstructive sleep apnea (adult) (pediatric): Secondary | ICD-10-CM | POA: Diagnosis not present

## 2023-05-21 DIAGNOSIS — F331 Major depressive disorder, recurrent, moderate: Secondary | ICD-10-CM | POA: Diagnosis not present

## 2023-05-21 DIAGNOSIS — Z79899 Other long term (current) drug therapy: Secondary | ICD-10-CM | POA: Diagnosis not present

## 2023-05-21 DIAGNOSIS — F419 Anxiety disorder, unspecified: Secondary | ICD-10-CM | POA: Diagnosis not present

## 2023-06-03 DIAGNOSIS — M2242 Chondromalacia patellae, left knee: Secondary | ICD-10-CM | POA: Diagnosis not present

## 2023-06-03 DIAGNOSIS — M25562 Pain in left knee: Secondary | ICD-10-CM | POA: Diagnosis not present

## 2023-06-03 DIAGNOSIS — Z9889 Other specified postprocedural states: Secondary | ICD-10-CM | POA: Diagnosis not present

## 2023-06-22 ENCOUNTER — Other Ambulatory Visit (HOSPITAL_COMMUNITY): Payer: Self-pay

## 2023-06-30 DIAGNOSIS — G4733 Obstructive sleep apnea (adult) (pediatric): Secondary | ICD-10-CM | POA: Diagnosis not present

## 2023-06-30 DIAGNOSIS — I38 Endocarditis, valve unspecified: Secondary | ICD-10-CM | POA: Diagnosis not present

## 2023-06-30 DIAGNOSIS — E782 Mixed hyperlipidemia: Secondary | ICD-10-CM | POA: Diagnosis not present

## 2023-06-30 DIAGNOSIS — I10 Essential (primary) hypertension: Secondary | ICD-10-CM | POA: Diagnosis not present

## 2023-07-16 ENCOUNTER — Encounter: Payer: Self-pay | Admitting: Family Medicine

## 2023-07-16 MED ORDER — IPRATROPIUM BROMIDE 0.06 % NA SOLN
2.0000 | Freq: Three times a day (TID) | NASAL | 0 refills | Status: DC | PRN
  Filled 2023-07-16: qty 15, 25d supply, fill #0

## 2023-07-17 ENCOUNTER — Other Ambulatory Visit (HOSPITAL_COMMUNITY): Payer: Self-pay

## 2023-07-22 ENCOUNTER — Other Ambulatory Visit (HOSPITAL_COMMUNITY): Payer: Self-pay

## 2023-07-22 DIAGNOSIS — J0101 Acute recurrent maxillary sinusitis: Secondary | ICD-10-CM | POA: Diagnosis not present

## 2023-07-22 DIAGNOSIS — J310 Chronic rhinitis: Secondary | ICD-10-CM | POA: Diagnosis not present

## 2023-07-22 MED ORDER — AMOXICILLIN-POT CLAVULANATE 500-125 MG PO TABS
1.0000 | ORAL_TABLET | Freq: Two times a day (BID) | ORAL | 0 refills | Status: DC
  Filled 2023-07-22: qty 28, 14d supply, fill #0

## 2023-07-23 ENCOUNTER — Other Ambulatory Visit (HOSPITAL_COMMUNITY): Payer: Self-pay

## 2023-08-14 DIAGNOSIS — F419 Anxiety disorder, unspecified: Secondary | ICD-10-CM | POA: Diagnosis not present

## 2023-08-14 DIAGNOSIS — F331 Major depressive disorder, recurrent, moderate: Secondary | ICD-10-CM | POA: Diagnosis not present

## 2023-08-14 DIAGNOSIS — Z79899 Other long term (current) drug therapy: Secondary | ICD-10-CM | POA: Diagnosis not present

## 2023-08-24 DIAGNOSIS — G4733 Obstructive sleep apnea (adult) (pediatric): Secondary | ICD-10-CM | POA: Diagnosis not present

## 2023-08-26 DIAGNOSIS — E782 Mixed hyperlipidemia: Secondary | ICD-10-CM | POA: Diagnosis not present

## 2023-08-26 DIAGNOSIS — I10 Essential (primary) hypertension: Secondary | ICD-10-CM | POA: Diagnosis not present

## 2023-08-26 DIAGNOSIS — G4733 Obstructive sleep apnea (adult) (pediatric): Secondary | ICD-10-CM | POA: Diagnosis not present

## 2023-08-26 DIAGNOSIS — I38 Endocarditis, valve unspecified: Secondary | ICD-10-CM | POA: Diagnosis not present

## 2023-09-11 DIAGNOSIS — G4733 Obstructive sleep apnea (adult) (pediatric): Secondary | ICD-10-CM | POA: Diagnosis not present

## 2023-09-12 DIAGNOSIS — G4733 Obstructive sleep apnea (adult) (pediatric): Secondary | ICD-10-CM | POA: Diagnosis not present

## 2023-09-28 ENCOUNTER — Other Ambulatory Visit: Payer: Self-pay | Admitting: Family Medicine

## 2023-09-28 ENCOUNTER — Other Ambulatory Visit (HOSPITAL_COMMUNITY): Payer: Self-pay

## 2023-09-28 MED ORDER — IPRATROPIUM BROMIDE 0.06 % NA SOLN
2.0000 | Freq: Three times a day (TID) | NASAL | 0 refills | Status: DC | PRN
  Filled 2023-09-28: qty 15, 13d supply, fill #0

## 2023-09-29 ENCOUNTER — Other Ambulatory Visit (HOSPITAL_COMMUNITY): Payer: Self-pay

## 2023-09-30 ENCOUNTER — Other Ambulatory Visit (HOSPITAL_COMMUNITY): Payer: Self-pay

## 2023-09-30 MED ORDER — AMOXICILLIN-POT CLAVULANATE 500-125 MG PO TABS
1.0000 | ORAL_TABLET | Freq: Two times a day (BID) | ORAL | 0 refills | Status: DC
  Filled 2023-09-30: qty 28, 14d supply, fill #0

## 2023-10-22 ENCOUNTER — Encounter: Payer: Self-pay | Admitting: Endocrinology

## 2023-10-22 ENCOUNTER — Ambulatory Visit (INDEPENDENT_AMBULATORY_CARE_PROVIDER_SITE_OTHER): Admitting: Endocrinology

## 2023-10-22 ENCOUNTER — Other Ambulatory Visit (HOSPITAL_COMMUNITY): Payer: Self-pay

## 2023-10-22 VITALS — BP 126/80 | HR 73 | Resp 16 | Ht 66.0 in | Wt 216.8 lb

## 2023-10-22 DIAGNOSIS — E278 Other specified disorders of adrenal gland: Secondary | ICD-10-CM | POA: Diagnosis not present

## 2023-10-22 DIAGNOSIS — E230 Hypopituitarism: Secondary | ICD-10-CM | POA: Diagnosis not present

## 2023-10-22 MED ORDER — DEXAMETHASONE 1 MG PO TABS
ORAL_TABLET | ORAL | 0 refills | Status: DC
  Filled 2023-10-22: qty 1, 1d supply, fill #0

## 2023-10-22 NOTE — Progress Notes (Signed)
 Outpatient Endocrinology Note Charles Karder Goodin, MD   Patient's Name: Charles Costa    DOB: May 23, 1965    MRN: [id number]  REASON OF VISIT: New consult for adrenal nodule.  REFERRING PROVIDER: Madelon Donald HERO, DO  PCP:  Tharon Lung, MD  HISTORY OF PRESENT ILLNESS:   Charles Costa is a 58 y.o. old male with past medical history listed below, is here for new consult for right adrenal nodule.  Pertinent Hx: Patient is referred to endocrinology for evaluation and management of known right adrenal nodule.  Initial consult on October 22, 2023.  Medical records reviewed in Care Everywhere.  Right adrenal nodule 2 cm : Patient was found to have right adrenal incidentaloma on CT scan completed in July 24, 2018.  CT abdomen in September 2021 showed right adrenal nodule 2 cm well-circumscribed homogeneous washout characteristics compatible with adrenal adenoma.  CT abdomen in November 2022 stable low-density right adrenal nodule measuring 2.1 cm.  Normal left adrenal gland.  Patient had biochemical evaluation for right adrenal nodule in December 2021 with normal plasma free metanephrines, normal aldosterone and renin, normal response with 1 mg dexamethasone  overnight suppression test with cortisol of 1.1.    Patient was evaluated and seen by endocrinology at [facility] in the past, patient wanted to switch care to the [facility] and referred to endocrinology.  Patient has controlled blood pressure, denies palpitation.  No easy bruising, proximal muscle weakness or significant weight gain.  He has no features suggestive of pheochromocytoma, Cushing syndrome or hyperaldosteronism.  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 had been on testosterone  injection, off of testosterone  therapy  since the spring 2025.  He also has spermatocele and hydrocele.    He wants evaluation and management of adrenal nodule, not hypogonadism in this clinic. He wants to continue follow-up and treatment with urology at [facility] for hypogonadism.   CT abdomen in September 2021 :  Arising from the RIGHT adrenal gland is a smoothly marginated 2.0 x 1.9 cm lesion displaying homogeneous density. Baseline noncontrast density 31 Hounsfield units. Post-contrast venous phase 91 Hounsfield units. Delayed phase with density of 49 Hounsfield units.  # He has obstructive sleep apnea, on CPAP and reports compliance with it.    REVIEW OF SYSTEMS:  As per history of present illness.   PAST MEDICAL HISTORY: Past Medical History:  Diagnosis Date   Anxiety    Depression    Episodic lightheadedness 01/12/2023   GERD (gastroesophageal reflux disease)    Healthcare maintenance 02/20/2021   Hiatal hernia    History of long-term use of multiple prescription drugs 06/13/2022   Insomnia    Post-nasal drip 02/20/2021   Sleep apnea    SOB (shortness of breath) 11/30/2019    PAST SURGICAL HISTORY: Past Surgical History:  Procedure Laterality Date   hydrocele     KNEE ARTHROSCOPY      ALLERGIES: Allergies  Allergen Reactions   Dust Mite Extract Other (See Comments) and Itching    Sinusitis, stuffy nose  Sinusitis, stuffy nose  Sinusitis, stuffy nose  Sinusitis, stuffy nose  Sinusitis, stuffy nose  Sinusitis, stuffy nose   Poison Ivy Extract Other (See Comments)   Poison Oak Extract Other (See Comments)   Other     Saw dust; sinusitis   Statins Other (See Comments)  Intolerance.    FAMILY HISTORY:  Family History  Problem Relation Age of Onset   Obesity Mother    Heart failure Father    Heart disease Father     SOCIAL HISTORY: Social History   Socioeconomic History   Marital status: Single    Spouse name: Not on file   Number of children: Not on file   Years of  education: Not on file   Highest education level: Bachelor's degree (e.g., BA, AB, BS)  Occupational History   Not on file  Tobacco Use   Smoking status: Former    Types: Cigars    Passive exposure: Past   Smokeless tobacco: Never  Substance and Sexual Activity   Alcohol use: No   Drug use: No   Sexual activity: Not on file  Other Topics Concern   Not on file  Social History Narrative   Not on file   Social Drivers of Health   Financial Resource Strain: High Risk (01/08/2023)   Overall Financial Resource Strain (CARDIA)    Difficulty of Paying Living Expenses: Very hard  Food Insecurity: No Food Insecurity (01/08/2023)   Hunger Vital Sign    Worried About Running Out of Food in the Last Year: Never true    Ran Out of Food in the Last Year: Never true  Transportation Needs: No Transportation Needs (01/08/2023)   PRAPARE - Administrator, Civil Service (Medical): No    Lack of Transportation (Non-Medical): No  Physical Activity: Insufficiently Active (01/08/2023)   Exercise Vital Sign    Days of Exercise per Week: 1 day    Minutes of Exercise per Session: 30 min  Stress: Stress Concern Present (01/08/2023)   [employer] of Occupational Health - Occupational Stress Questionnaire    Feeling of Stress : To some extent  Social Connections: Socially Isolated (01/08/2023)   Social Connection and Isolation Panel    Frequency of Communication with Friends and Family: Never    Frequency of Social Gatherings with Friends and Family: Never    Attends Religious Services: Never    Database administrator or Organizations: No    Attends Engineer, structural: Not on file    Marital Status: Never married    MEDICATIONS:  Current Outpatient Medications  Medication Sig Dispense Refill   benazepril  (LOTENSIN ) 40 MG tablet Take 1 tablet (40 mg total) by mouth daily. 90 tablet 3   dexamethasone  (DECADRON ) 1 MG tablet Take 1 tablet by mouth once at 11 pm and have lab for  cortisol next morning at 8 AM. 1 tablet 0   diazepam (VALIUM) 5 MG tablet Take 5 mg by mouth as directed. Taking one tablet 3-4 times a day as needed for anxiety.     Evolocumab (REPATHA) 140 MG/ML SOSY Inject 1 mL into the skin every 14 (fourteen) days.     Multiple Vitamins-Minerals (CENTRUM SILVER 50+MEN PO) Take by mouth.     omeprazole (PRILOSEC) 20 MG capsule Take 20 mg by mouth daily.     sertraline (ZOLOFT) 100 MG tablet Take 100 mg by mouth daily.     triamcinolone (NASACORT) 55 MCG/ACT AERO nasal inhaler Place 2 sprays into the nose daily.     No current facility-administered medications for this visit.    PHYSICAL EXAM: Vitals:   10/22/23 1440  BP: 126/80  Pulse: 73  Resp: 16  SpO2: 96%  Weight: 216 lb 12.8 oz (98.3 kg)  Height: 5' 6 (1.676 m)  Body mass index is 34.99 kg/m.  Wt Readings from Last 3 Encounters:  10/22/23 216 lb 12.8 oz (98.3 kg)  04/14/23 225 lb 8 oz (102.3 kg)  02/10/23 230 lb 3.2 oz (104.4 kg)    General: Well developed, well nourished male in no apparent distress.  HEENT: [place], no external lesions. Hearing intact to the spoken word Eyes: Conjunctiva clear and no icterus. Neck: Trachea midline, neck supple without appreciable thyromegaly or lymphadenopathy and no palpable thyroid  nodules Abdomen: Soft, non tender, non distended, no striae. Midline line / R subcostal large scar + Neurologic: Alert, oriented, normal speech, deep tendon biceps reflexes normal,  no gross focal neurological deficit. No obvious proximal weakness Extremities: No pedal pitting edema, no tremors of outstretched hands, no excessive hair growth Skin: Warm, color good. No bruises. Psychiatric: Does not appear depressed or anxious   PERTINENT HISTORIC LABORATORY AND IMAGING STUDIES:  All pertinent laboratory results were reviewed. Please see HPI also for further details.   ASSESSMENT / PLAN  1. Right adrenal mass   2. Hypogonadotropic hypogonadism    Patient was  incidentally found to have right adrenal nodule in July 2020, CT abdomen, repeat CT scan multiple times including September 2021 and November 2022 with stable size.  He had biochemical evaluation of right adrenal nodule in December 2021 with normal plasma free metanephrines, normal renin and aldosterone, normal 1 mg dexamethasone  suppression test.  He has normal left adrenal gland.  - He has no clinical features suggestive of pheochromocytoma, Cushing syndrome and hyperaldosteronism.  Plan: - Will check for biochemical test /workup for right adrenal mass with plasma free metanephrines, 1 mg dexamethasone  overnight suppression test, renin/aldosterone. - Patient will complete lab in the early morning fasting, order placed.  Instructions provided. - Provided these test results are normal no additional tests for adrenal nodule will be routinely required unless clinically indicated. - No plan for repeat CT scan for adrenal nodule /adrenal glands, no routine CT adrenal is required unless clinically indicated in the future.  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 in the past.  He wants to follow-up with urology at [facility] for the treatment of hypogonadism.  He reports he has follow-up with urology in the near future.    Diagnoses and all orders for this visit:  Right adrenal mass -     dexamethasone  (DECADRON ) 1 MG tablet; Take 1 tablet by mouth once at 11 pm and have lab for cortisol next morning at 8 AM. -     Cortisol -     Metanephrines, plasma -     Dexamethasone , blood -     Basic metabolic panel with GFR -     Aldosterone -     Renin  Hypogonadotropic hypogonadism   DISPOSITION Follow up in clinic in 12 months suggested.  All questions answered and patient verbalized understanding of the plan.  Charles Carla Whilden, MD [facility] Endocrinology [facility] [address] [place], [address] [place], [place] [address] Phone # [phone]  At least part of this note was generated using voice recognition software. Inadvertent word errors may have occurred, which were not recognized during the proofreading process.

## 2023-10-22 NOTE — Patient Instructions (Signed)
1 mg dexamethasone overnight suppression test. Instruction: Take 1 mg dexamethasone tablet at 11 PM ( timing is important) and have blood work for cortisol test in the next morning at 8 AM with fasting (timing is important).

## 2023-10-28 ENCOUNTER — Other Ambulatory Visit

## 2023-10-28 DIAGNOSIS — E278 Other specified disorders of adrenal gland: Secondary | ICD-10-CM | POA: Diagnosis not present

## 2023-11-04 ENCOUNTER — Ambulatory Visit: Payer: Self-pay | Admitting: Endocrinology

## 2023-11-08 LAB — BASIC METABOLIC PANEL WITH GFR
BUN/Creatinine Ratio: 31 (calc) — ABNORMAL HIGH (ref 6–22)
BUN: 21 mg/dL (ref 7–25)
CO2: 25 mmol/L (ref 20–32)
Calcium: 9.5 mg/dL (ref 8.6–10.3)
Chloride: 104 mmol/L (ref 98–110)
Creat: 0.67 mg/dL — ABNORMAL LOW (ref 0.70–1.30)
Glucose, Bld: 109 mg/dL — ABNORMAL HIGH (ref 65–99)
Potassium: 4.1 mmol/L (ref 3.5–5.3)
Sodium: 137 mmol/L (ref 135–146)
eGFR: 110 mL/min/1.73m2 (ref >=60)

## 2023-11-08 LAB — CORTISOL: Cortisol, Plasma: 1.3 ug/dL — ABNORMAL LOW

## 2023-11-08 LAB — METANEPHRINES, PLASMA
Metanephrine, Free: <25 pg/mL (ref <=57)
Normetanephrine, Free: 25 pg/mL (ref <=148)
Total Metanephrines-Plasma: 25 pg/mL (ref <=205)

## 2023-11-08 LAB — RENIN: Renin Activity: 9.45 ng/mL/h — ABNORMAL HIGH (ref 0.25–5.82)

## 2023-11-08 LAB — ALDOSTERONE: Aldosterone: 5 ng/dL

## 2023-11-08 LAB — DEXAMETHASONE, BLOOD: Dexamethasone, Serum: 227 ng/dL

## 2023-11-11 DIAGNOSIS — E291 Testicular hypofunction: Secondary | ICD-10-CM | POA: Diagnosis not present

## 2023-11-17 DIAGNOSIS — H25013 Cortical age-related cataract, bilateral: Secondary | ICD-10-CM | POA: Diagnosis not present

## 2023-11-17 DIAGNOSIS — H2512 Age-related nuclear cataract, left eye: Secondary | ICD-10-CM | POA: Diagnosis not present

## 2023-11-17 DIAGNOSIS — H2513 Age-related nuclear cataract, bilateral: Secondary | ICD-10-CM | POA: Diagnosis not present

## 2023-11-17 DIAGNOSIS — H18413 Arcus senilis, bilateral: Secondary | ICD-10-CM | POA: Diagnosis not present

## 2023-11-17 DIAGNOSIS — H25043 Posterior subcapsular polar age-related cataract, bilateral: Secondary | ICD-10-CM | POA: Diagnosis not present

## 2023-11-19 ENCOUNTER — Other Ambulatory Visit (HOSPITAL_COMMUNITY): Payer: Self-pay

## 2023-11-19 ENCOUNTER — Encounter: Payer: Self-pay | Admitting: Family Medicine

## 2023-11-19 MED ORDER — IPRATROPIUM BROMIDE 0.06 % NA SOLN
2.0000 | Freq: Four times a day (QID) | NASAL | 1 refills | Status: AC | PRN
  Filled 2023-11-19: qty 15, 30d supply, fill #0

## 2023-11-26 DIAGNOSIS — Z79899 Other long term (current) drug therapy: Secondary | ICD-10-CM | POA: Diagnosis not present

## 2023-11-26 DIAGNOSIS — F331 Major depressive disorder, recurrent, moderate: Secondary | ICD-10-CM | POA: Diagnosis not present

## 2023-12-07 DIAGNOSIS — G4733 Obstructive sleep apnea (adult) (pediatric): Secondary | ICD-10-CM | POA: Diagnosis not present

## 2023-12-07 DIAGNOSIS — I10 Essential (primary) hypertension: Secondary | ICD-10-CM | POA: Diagnosis not present

## 2023-12-07 DIAGNOSIS — J984 Other disorders of lung: Secondary | ICD-10-CM | POA: Diagnosis not present

## 2023-12-07 DIAGNOSIS — R0602 Shortness of breath: Secondary | ICD-10-CM | POA: Diagnosis not present

## 2023-12-08 DIAGNOSIS — E785 Hyperlipidemia, unspecified: Secondary | ICD-10-CM | POA: Diagnosis not present

## 2023-12-08 DIAGNOSIS — E230 Hypopituitarism: Secondary | ICD-10-CM | POA: Diagnosis not present

## 2023-12-08 DIAGNOSIS — I10 Essential (primary) hypertension: Secondary | ICD-10-CM | POA: Diagnosis not present

## 2023-12-08 DIAGNOSIS — Z0000 Encounter for general adult medical examination without abnormal findings: Secondary | ICD-10-CM | POA: Diagnosis not present

## 2023-12-08 DIAGNOSIS — Z23 Encounter for immunization: Secondary | ICD-10-CM | POA: Diagnosis not present

## 2023-12-10 DIAGNOSIS — G4733 Obstructive sleep apnea (adult) (pediatric): Secondary | ICD-10-CM | POA: Diagnosis not present

## 2024-01-29 ENCOUNTER — Other Ambulatory Visit: Payer: Self-pay

## 2024-01-29 ENCOUNTER — Encounter: Payer: Self-pay | Admitting: Family Medicine

## 2024-01-29 ENCOUNTER — Ambulatory Visit: Payer: Self-pay | Admitting: Family Medicine

## 2024-01-29 ENCOUNTER — Other Ambulatory Visit (HOSPITAL_COMMUNITY): Payer: Self-pay

## 2024-01-29 VITALS — BP 137/84 | HR 61 | Ht 66.0 in | Wt 227.4 lb

## 2024-01-29 DIAGNOSIS — E66812 Obesity, class 2: Secondary | ICD-10-CM

## 2024-01-29 DIAGNOSIS — F339 Major depressive disorder, recurrent, unspecified: Secondary | ICD-10-CM

## 2024-01-29 DIAGNOSIS — E538 Deficiency of other specified B group vitamins: Secondary | ICD-10-CM | POA: Diagnosis not present

## 2024-01-29 DIAGNOSIS — K219 Gastro-esophageal reflux disease without esophagitis: Secondary | ICD-10-CM | POA: Diagnosis not present

## 2024-01-29 DIAGNOSIS — D509 Iron deficiency anemia, unspecified: Secondary | ICD-10-CM | POA: Diagnosis not present

## 2024-01-29 DIAGNOSIS — R251 Tremor, unspecified: Secondary | ICD-10-CM

## 2024-01-29 DIAGNOSIS — I10 Essential (primary) hypertension: Secondary | ICD-10-CM | POA: Diagnosis not present

## 2024-01-29 DIAGNOSIS — G969 Disorder of central nervous system, unspecified: Secondary | ICD-10-CM

## 2024-01-29 DIAGNOSIS — G4730 Sleep apnea, unspecified: Secondary | ICD-10-CM | POA: Diagnosis not present

## 2024-01-29 DIAGNOSIS — H579 Unspecified disorder of eye and adnexa: Secondary | ICD-10-CM | POA: Diagnosis not present

## 2024-01-29 DIAGNOSIS — R7303 Prediabetes: Secondary | ICD-10-CM

## 2024-01-29 LAB — POCT GLYCOSYLATED HEMOGLOBIN (HGB A1C): Hemoglobin A1C: 5.3 % (ref 4.0–5.6)

## 2024-01-29 MED ORDER — AMLODIPINE BESY-BENAZEPRIL HCL 5-40 MG PO CAPS
1.0000 | ORAL_CAPSULE | Freq: Every day | ORAL | 0 refills | Status: DC
  Filled 2024-01-29: qty 30, 30d supply, fill #0

## 2024-01-29 NOTE — Progress Notes (Signed)
" ° °  SUBJECTIVE:   CHIEF COMPLAINT / HPI:  Discussed the use of AI scribe software for clinical note transcription with the patient, who gave verbal consent to proceed.  History of Present Illness Charles Costa is a 59 year old male with hypertension who presents for a blood pressure medication refill and evaluation of medical symptoms.  Hypertension management - Blood pressure has been stable on benazepril  40 mg daily. - Requests refills. - Open to possible medication adjustments, if needed. - Concerned about blood pressure control in the context of weight and activity changes.  Visual disturbances - Chronic issues with depth perception and color vision. - Ophthalmology evaluation in the past revealed mild cataracts and dry eyes; no surgery recommended. - Desires further evaluation for ongoing vision concerns.  Sleep disturbance - Desires further evaluation for ongoing sleep concerns from a pulmonologist.  Hand tremors - Tremors worsen with stress and after heavy exercise. - Less physically active recently, but swam 500 meters last week, which seemed to improve symptoms. - On testosterone  replacement therapy from urology; concerned it may worsen tremors. - Monitored blood work on prior testosterone  therapy for elevated hemoglobin or platelets.  Epigastric pain - Recurrent epigastric pain radiating to the chest. - Symptoms improve with famotidine  and Prilosec, used as needed.  Overweight and physical activity - Overweight and desires weight loss. - Cold weather has limited outdoor activity. - Swims laps about once weekly. - Interested in increasing physical activity.   OBJECTIVE:  BP 137/84   Pulse 61   Ht 5' 6 (1.676 m)   Wt 227 lb 6.4 oz (103.1 kg)   SpO2 94%   BMI 36.70 kg/m   Physical Exam GENERAL: Alert, cooperative, well developed, no acute distress.. CHEST: Clear to auscultation bilaterally, no wheezes, rhonchi, or crackles. CARDIOVASCULAR: Normal heart rate  and rhythm, S1 and S2 normal without murmurs. ABDOMEN: Soft, non-distended from baseline, normal bowel sounds. NEUROLOGICAL: Cranial nerves grossly intact, moves all extremities without gross motor or sensory deficit.  ASSESSMENT/PLAN:   Assessment & Plan Prediabetes A1c stable at 5.3. Continue working on excellent lifestyle modifications. Lipid panel collected today. Class 2 obesity without serious comorbidity in adult, unspecified BMI, unspecified obesity type Will refer to healthy weight and wellness for further management given desire for multidisciplinary weight management team. Sleep apnea, unspecified type Stable, though will refer to new pulmonologist for further treatment. Gastroesophageal reflux disease without esophagitis Improved with PPI and H2B prn. Continue regimen. Can use H2B daily if needed. Primary hypertension Above preferred goal of 130/80 on benazepril  40 mg daily. Patient prefers to remain on this dose and continue lifestyle management at this time instead of switching to combo pill which I think is reasonable. He will follow up with me in 1 week to recheck and follow up on other medical concerns. Episode of recurrent major depressive disorder, unspecified depression episode severity Continue to follow closely for medical management with psychiatry. Negative #9 today on PHQ-9. Provided with therapy resources. 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 If consistently worsening, can consider neurology consultation. Vitamin B12 and B6 deficiency Update levels today given supplementation. Iron deficiency anemia, unspecified iron deficiency anemia type Update iron studies today. Visual complaint Concern for cataracts per last ophthalmology consultation. I have referred to a new ophthalmologist for further evaluation.  Stuart Redo, MD [facility]  "

## 2024-01-29 NOTE — Patient Instructions (Addendum)
 I have sent in a new referral to ophthalmology and to pulmonology to try and get a second opinion.  We will switch to Lotrel today (amlodipine and benazepril ) to help lower blood pressure more. STOP the benazepril  alone. Let me know BP values on MyChart over the next week or so.  Therapy and Counseling Resources Most providers on this list will take Medicaid. Patients with commercial insurance or Medicare should contact their insurance company to get a list of in network providers.  [facility] (takes children) Location 1: [address] [place], [place] [address] Location 2: [address] [place], [place] [address] [phone]   [facility] (spanish speaking therapist available)(habla espanol)(take medicare and medicaid)  [address], [place], [place] [address], [place]  [email or web address] [phone]  [facility] [address] [place], [place] [address] [phone]  [facility]   [address], Suite [place], [place] [address]      [phone]  [facility] (spanish available) [address]  [place], [place] [address] [phone]  [facility] (take [facility] and medicare) [address], [address]  [place], [place] [address]       [phone]     [facility] (virtual only) [phone]  Janit Griff[facility] [address] [place], [place] [phone]  [facility]:  [address] [place] [phone]  [facility]:  [address] [place] [phone]  [facility] (under & uninsured) [address]   [place] [phone]    [email or web address]    [facility] [phone] [address]  [place]    [phone]  [facility] -[address]     Phone:  [phone]     [facility]-  [address]   [phone]   [facility] [address]      [place], [place] [phone]  [facility]: [address] [place], [place], [place]  [phone]   [facility] (Spanish therapist) [email or web address]  [address] [place]  [address]   [place] [address]    [phone]    [facility]   [address],  [place], [place]  [phone]   [facility]  [address] [place]  [phone]  [facility]  [address] [place], [place]        [phone]  Open Access/Walk In Clinic under & uninsured  [facility]  [address] [place], [place] [phone] Crisis [phone]  [facility] [address],  (Spanish)   [address] , [place]: [phone]) 8:30 - 12; 1 - 2:30  [facility] [employer],  [address] [place], [place]    [phone]):8:30 - 12; 2 - 3PM  [facility] [employer],  [address],  [place]; [phone]):   Mon - Fri 8 AM - 5 PM  [facility] [address] [place]  MWF 12:30 to 3:00 or call to schedule an appointment  [phone]  Specific Provider options Psychology Today  [email or web address]  click on find a therapist  enter your zip code left side and select or tailor a therapist for your specific need.   [facility] Provider Directory [email or web address]  (Medicaid)   Follow all drop down to find a provider  Social  Support program [facility] [place] or photosolver.pl [address], [place], [place] Recovery support and educational   24- Hour Availability:   [facility]  [address] [place], [place] [phone] Crisis [phone]  [facility] [phone]  [place] [facility]  [phone]   [facility]  [phone] (after hours)  Therapeutic Alternative/Mobile Crisis   [phone]  [place]  National Suicide Hotline  (386)755-3786 [place])  Call 911 or go to emergency room  [facility]  [phone]);  [place] and [place]  [phone]); [place], [place], [place], [place], [place], [place], [place]

## 2024-01-30 ENCOUNTER — Other Ambulatory Visit (HOSPITAL_COMMUNITY): Payer: Self-pay

## 2024-01-30 ENCOUNTER — Ambulatory Visit: Payer: Self-pay | Admitting: Family Medicine

## 2024-01-30 MED ORDER — BENAZEPRIL HCL 40 MG PO TABS
40.0000 mg | ORAL_TABLET | Freq: Every day | ORAL | 3 refills | Status: AC
  Filled 2024-01-30: qty 90, 90d supply, fill #0

## 2024-01-30 NOTE — Assessment & Plan Note (Signed)
 Stable, though will refer to new pulmonologist for further treatment.

## 2024-01-30 NOTE — Assessment & Plan Note (Signed)
 Update iron studies today.

## 2024-01-30 NOTE — Assessment & Plan Note (Signed)
 Continue to follow closely for medical management with psychiatry. Negative #9 today on PHQ-9. Provided with therapy resources.

## 2024-01-30 NOTE — Assessment & Plan Note (Signed)
 Improved with PPI and H2B prn. Continue regimen. Can use H2B daily if needed.

## 2024-01-30 NOTE — Assessment & Plan Note (Signed)
 Above preferred goal of 130/80 on benazepril  40 mg daily. Patient prefers to remain on this dose and continue lifestyle management at this time instead of switching to combo pill which I think is reasonable. He will follow up with me in 1 week to recheck and follow up on other medical concerns.

## 2024-01-30 NOTE — Assessment & Plan Note (Signed)
 On chronic benzodiazepines. If consistently worsening, can consider neurology consultation.

## 2024-01-30 NOTE — Assessment & Plan Note (Signed)
 Update levels today given supplementation.

## 2024-02-01 ENCOUNTER — Other Ambulatory Visit (HOSPITAL_COMMUNITY): Payer: Self-pay

## 2024-02-01 ENCOUNTER — Other Ambulatory Visit: Payer: Self-pay

## 2024-02-02 ENCOUNTER — Encounter: Payer: Self-pay | Admitting: Family Medicine

## 2024-02-02 ENCOUNTER — Other Ambulatory Visit: Payer: Self-pay | Admitting: Family Medicine

## 2024-02-02 DIAGNOSIS — K219 Gastro-esophageal reflux disease without esophagitis: Secondary | ICD-10-CM

## 2024-02-02 DIAGNOSIS — G4733 Obstructive sleep apnea (adult) (pediatric): Secondary | ICD-10-CM

## 2024-02-02 DIAGNOSIS — K449 Diaphragmatic hernia without obstruction or gangrene: Secondary | ICD-10-CM

## 2024-02-02 DIAGNOSIS — E278 Other specified disorders of adrenal gland: Secondary | ICD-10-CM

## 2024-02-02 DIAGNOSIS — J984 Other disorders of lung: Secondary | ICD-10-CM

## 2024-02-02 DIAGNOSIS — J342 Deviated nasal septum: Secondary | ICD-10-CM

## 2024-02-02 DIAGNOSIS — D509 Iron deficiency anemia, unspecified: Secondary | ICD-10-CM

## 2024-02-02 DIAGNOSIS — N281 Cyst of kidney, acquired: Secondary | ICD-10-CM

## 2024-02-03 LAB — CBC WITH DIFFERENTIAL/PLATELET
Basophils Absolute: 0 10*3/uL (ref 0.0–0.2)
Basos: 1 %
EOS (ABSOLUTE): 0.1 10*3/uL (ref 0.0–0.4)
Eos: 3 %
Hematocrit: 43.5 % (ref 37.5–51.0)
Hemoglobin: 14.8 g/dL (ref 13.0–17.7)
Immature Grans (Abs): 0 10*3/uL (ref 0.0–0.1)
Immature Granulocytes: 0 %
Lymphocytes Absolute: 1.4 10*3/uL (ref 0.7–3.1)
Lymphs: 25 %
MCH: 33 pg (ref 26.6–33.0)
MCHC: 34 g/dL (ref 31.5–35.7)
MCV: 97 fL (ref 79–97)
Monocytes Absolute: 0.3 10*3/uL (ref 0.1–0.9)
Monocytes: 6 %
Neutrophils Absolute: 3.7 10*3/uL (ref 1.4–7.0)
Neutrophils: 65 %
Platelets: 227 10*3/uL (ref 150–450)
RBC: 4.48 x10E6/uL (ref 4.14–5.80)
RDW: 12.6 % (ref 11.6–15.4)
WBC: 5.6 10*3/uL (ref 3.4–10.8)

## 2024-02-03 LAB — BASIC METABOLIC PANEL WITH GFR
BUN/Creatinine Ratio: 12 (ref 9–20)
BUN: 9 mg/dL (ref 6–24)
CO2: 21 mmol/L (ref 20–29)
Calcium: 9.3 mg/dL (ref 8.7–10.2)
Chloride: 103 mmol/L (ref 96–106)
Creatinine, Ser: 0.76 mg/dL (ref 0.76–1.27)
Glucose: 88 mg/dL (ref 70–99)
Potassium: 4.3 mmol/L (ref 3.5–5.2)
Sodium: 140 mmol/L (ref 134–144)
eGFR: 105 mL/min/{1.73_m2}

## 2024-02-03 LAB — IRON AND TIBC
Iron Saturation: 26 % (ref 15–55)
Iron: 97 ug/dL (ref 38–169)
Total Iron Binding Capacity: 374 ug/dL (ref 250–450)
UIBC: 277 ug/dL (ref 111–343)

## 2024-02-03 LAB — LIPID PANEL
Chol/HDL Ratio: 2.6 ratio (ref 0.0–5.0)
Cholesterol, Total: 160 mg/dL (ref 100–199)
HDL: 62 mg/dL
LDL Chol Calc (NIH): 68 mg/dL (ref 0–99)
Triglycerides: 183 mg/dL — ABNORMAL HIGH (ref 0–149)
VLDL Cholesterol Cal: 30 mg/dL (ref 5–40)

## 2024-02-03 LAB — VITAMIN B12: Vitamin B-12: 1016 pg/mL (ref 232–1245)

## 2024-02-03 LAB — VITAMIN B6: Vitamin B6: 25.7 ug/L (ref 3.4–65.2)

## 2024-02-03 LAB — FERRITIN: Ferritin: 302 ng/mL (ref 30–400)

## 2024-02-10 ENCOUNTER — Encounter (INDEPENDENT_AMBULATORY_CARE_PROVIDER_SITE_OTHER): Payer: Self-pay

## 2024-03-01 ENCOUNTER — Ambulatory Visit: Payer: Self-pay | Admitting: Family Medicine

## 2024-10-26 ENCOUNTER — Ambulatory Visit: Admitting: Endocrinology

## 2024-10-31 ENCOUNTER — Ambulatory Visit: Admitting: Endocrinology
